# Patient Record
Sex: Female | Born: 1935 | Race: Black or African American | Hispanic: No | Smoking: Never smoker
Health system: Southern US, Community
[De-identification: ages and names within clinical notes are randomized; demographics above are authoritative.]

## PROBLEM LIST (undated history)

## (undated) DIAGNOSIS — E785 Hyperlipidemia, unspecified: Secondary | ICD-10-CM

## (undated) DIAGNOSIS — C801 Malignant (primary) neoplasm, unspecified: Secondary | ICD-10-CM

## (undated) DIAGNOSIS — I10 Essential (primary) hypertension: Secondary | ICD-10-CM

## (undated) DIAGNOSIS — Z9221 Personal history of antineoplastic chemotherapy: Secondary | ICD-10-CM

## (undated) DIAGNOSIS — C50919 Malignant neoplasm of unspecified site of unspecified female breast: Secondary | ICD-10-CM

## (undated) HISTORY — PX: MASTECTOMY: SHX3

## (undated) HISTORY — DX: Essential (primary) hypertension: I10

## (undated) HISTORY — PX: HYSTERECTOMY: SHX81

## (undated) HISTORY — PX: CHOLECYSTECTOMY: SHX55

## (undated) HISTORY — DX: Malignant neoplasm of unspecified site of unspecified female breast: C50.919

## (undated) HISTORY — PX: ABDOMINAL HYSTERECTOMY: SHX81

---

## 1988-08-23 HISTORY — PX: MASTECTOMY: SHX3

## 1997-01-18 ENCOUNTER — Inpatient Hospital Stay
Admission: EM | Admit: 1997-01-18 | Disposition: A | Discharge status: Home / Self Care | Payer: Self-pay | Source: Emergency Department | Admitting: Internal Medicine

## 1997-10-04 ENCOUNTER — Emergency Department: Admit: 1997-10-04 | Payer: Self-pay | Source: Emergency Department

## 1997-10-23 ENCOUNTER — Ambulatory Visit: Admit: 1997-10-23 | Disposition: A | Discharge status: Home / Self Care | Payer: Self-pay | Source: Ambulatory Visit

## 1998-01-09 ENCOUNTER — Ambulatory Visit: Admit: 1998-01-09 | Disposition: A | Discharge status: Home / Self Care | Payer: Self-pay | Source: Ambulatory Visit

## 1998-02-21 ENCOUNTER — Ambulatory Visit: Admission: RE | Admit: 1998-02-21 | Payer: Self-pay | Source: Ambulatory Visit | Admitting: Gastroenterology

## 1998-11-04 ENCOUNTER — Ambulatory Visit: Admit: 1998-11-04 | Disposition: A | Discharge status: Home / Self Care | Payer: Self-pay | Source: Ambulatory Visit

## 1999-11-04 ENCOUNTER — Observation Stay
Admission: EM | Admit: 1999-11-04 | Disposition: A | Discharge status: Home / Self Care | Payer: Self-pay | Source: Emergency Department | Admitting: Counseling

## 1999-11-18 ENCOUNTER — Ambulatory Visit
Admit: 1999-11-18 | Disposition: A | Discharge status: Home / Self Care | Payer: Self-pay | Source: Ambulatory Visit | Admitting: Family Medicine

## 2006-11-16 ENCOUNTER — Emergency Department: Admit: 2006-11-16 | Payer: Self-pay | Source: Emergency Department | Admitting: Emergency Medicine

## 2006-11-16 LAB — BASIC METABOLIC PANEL
BUN: 19 mg/dL (ref 8–20)
CO2: 27 mEq/L (ref 21–30)
Calcium: 10.8 mg/dL — ABNORMAL HIGH (ref 8.6–10.2)
Chloride: 98 mEq/L (ref 98–107)
Creatinine: 1 mg/dL (ref 0.6–1.5)
Glucose: 107 mg/dL — ABNORMAL HIGH (ref 70–100)
Potassium: 3.5 mEq/L — ABNORMAL LOW (ref 3.6–5.0)
Sodium: 138 mEq/L (ref 136–146)

## 2006-11-16 LAB — CBC WITH AUTO DIFFERENTIAL CERNER
Basophils Absolute: 0 /mm3 (ref 0.0–0.2)
Basophils: 0 % (ref 0–2)
Eosinophils Absolute: 0 /mm3 (ref 0.0–0.7)
Eosinophils: 1 % (ref 0–5)
Granulocytes Absolute: 4.6 /mm3 (ref 1.8–8.1)
Hematocrit: 38.1 % (ref 37.0–47.0)
Hgb: 13.3 G/DL (ref 12.0–16.0)
Lymphocytes Absolute: 1.6 /mm3 (ref 0.5–4.4)
Lymphocytes: 24 % (ref 15–41)
MCH: 29.9 PG (ref 28.0–32.0)
MCHC: 34.9 G/DL (ref 32.0–36.0)
MCV: 85.7 FL (ref 80.0–100.0)
MPV: 8.7 FL (ref 7.4–10.4)
Monocytes Absolute: 0.3 /mm3 (ref 0.0–1.2)
Monocytes: 5 % (ref 0–11)
Neutrophils %: 70 % (ref 52–75)
Platelets: 268 /mm3 (ref 140–400)
RBC: 4.45 /mm3 (ref 4.20–5.40)
RDW: 12.8 % (ref 11.5–15.0)
WBC: 6.6 /mm3 (ref 3.5–10.8)

## 2006-11-16 LAB — URINALYSIS WITH MICROSCOPIC
Bilirubin, UA: NEGATIVE
Blood, UA: NEGATIVE
Glucose, UA: NEGATIVE
Ketones UA: 40
Nitrite, UA: NEGATIVE
Protein, UR: NEGATIVE
Specific Gravity UA POCT: 1.015 (ref 1.001–1.035)
Urine pH: 6.5 (ref 5.0–8.0)
Urobilinogen, UA: 0.2

## 2006-11-16 LAB — GFR

## 2006-11-16 LAB — D-DIMER - SOFT: D-Dimer: 3401 ng FEU

## 2009-08-05 ENCOUNTER — Ambulatory Visit: Payer: Self-pay

## 2009-08-05 ENCOUNTER — Ambulatory Visit: Admission: RE | Admit: 2009-08-05 | Payer: Self-pay | Source: Ambulatory Visit | Admitting: Gastroenterology

## 2009-08-07 LAB — LAB USE ONLY - HISTORICAL SURGICAL PATHOLOGY

## 2011-04-02 LAB — COMPREHENSIVE METABOLIC PANEL
ALT: 31 U/L (ref 3–36)
AST (SGOT): 39 U/L (ref 10–41)
Albumin/Globulin Ratio: 1.5 (ref 1.1–1.8)
Albumin: 4.8 g/dL (ref 3.4–4.9)
Alkaline Phosphatase: 62 U/L (ref 43–112)
BUN: 9 mg/dL (ref 8–20)
Bilirubin, Total: 0.6 mg/dL (ref 0.1–1.0)
CO2: 26 mEq/L (ref 21–30)
Calcium: 10.1 mg/dL (ref 8.6–10.2)
Chloride: 90 mEq/L — ABNORMAL LOW (ref 98–107)
Creatinine: 0.8 mg/dL (ref 0.6–1.5)
Globulin: 3.2 g/dL (ref 2.0–3.7)
Glucose: 98 mg/dL (ref 70–100)
Potassium: 3.6 mEq/L (ref 3.6–5.0)
Protein, Total: 8 g/dL (ref 6.0–8.0)
Sodium: 128 mEq/L — ABNORMAL LOW (ref 136–146)

## 2011-04-02 LAB — URINALYSIS, REFLEX TO MICROSCOPIC EXAM IF INDICATED
Bilirubin, UA: NEGATIVE
Blood, UA: NEGATIVE
Glucose, UA: NEGATIVE
Ketones UA: 10
Leukocyte Esterase, UA: NEGATIVE
Nitrite, UA: NEGATIVE
Protein, UR: NEGATIVE
Specific Gravity UA POCT: 1.005 (ref 1.001–1.035)
Urine pH: 6.5 (ref 5.0–8.0)
Urobilinogen, UA: NORMAL mg/dL

## 2011-04-02 LAB — CBC AND DIFFERENTIAL
Basophils Absolute Automated: 0 10*3/uL (ref 0.00–0.20)
Basophils Automated: 0 % (ref 0–2)
Eosinophils Absolute Automated: 0.04 10*3/uL (ref 0.00–0.70)
Eosinophils Automated: 1 % (ref 0–5)
Hematocrit: 34.2 % — ABNORMAL LOW (ref 37.0–47.0)
Hgb: 12.3 g/dL (ref 12.0–16.0)
Immature Granulocytes Absolute: 0.01 10*3/uL
Immature Granulocytes: 0 % (ref 0–1)
Lymphocytes Absolute Automated: 1.97 10*3/uL (ref 0.50–4.40)
Lymphocytes Automated: 34 % (ref 15–41)
MCH: 29.1 pg (ref 28.0–32.0)
MCHC: 36 g/dL (ref 32.0–36.0)
MCV: 80.9 fL (ref 80.0–100.0)
MPV: 10.3 fL (ref 9.4–12.3)
Monocytes Absolute Automated: 0.34 10*3/uL (ref 0.00–1.20)
Monocytes: 6 % (ref 0–11)
Neutrophils Absolute: 3.51 10*3/uL (ref 1.80–8.10)
Neutrophils: 60 % (ref 52–75)
Nucleated RBC: 0 /100 WBC
Platelets: 270 10*3/uL (ref 140–400)
RBC: 4.23 10*6/uL (ref 4.20–5.40)
RDW: 12 % (ref 12–15)
WBC: 5.87 10*3/uL (ref 3.50–10.80)

## 2011-04-02 LAB — GFR: EGFR: >60

## 2011-04-02 LAB — IHS D-DIMER: D-Dimer: 4.85 ug/mL FEU — ABNORMAL HIGH (ref 0.00–0.49)

## 2011-04-02 LAB — I-STAT TROPONIN: i-STAT Troponin: 0 ng/mL (ref 0.00–0.09)

## 2011-04-03 ENCOUNTER — Inpatient Hospital Stay
Admission: EM | Admit: 2011-04-03 | Disposition: A | Discharge status: Home / Self Care | Payer: Self-pay | Source: Emergency Department | Attending: Hospitalist | Admitting: Hospitalist

## 2011-04-03 LAB — LIPID PANEL
Cholesterol / HDL Ratio: 3 Index
Cholesterol: 186 mg/dL (ref 0–199)
HDL: 63 mg/dL (ref >40)
LDL Calculated: 104 mg/dL — ABNORMAL HIGH (ref 0–99)
Triglycerides: 97 mg/dL (ref 34–149)
VLDL Calculated: 19 mg/dL (ref 10–40)

## 2011-04-03 LAB — FOLATE: Folate: 23.9 ng/mL

## 2011-04-03 LAB — TSH: TSH: 1.326 u[IU]/mL (ref 0.350–4.940)

## 2011-04-03 LAB — I-STAT TROPONIN: i-STAT Troponin: 0.01 ng/mL (ref 0.00–0.09)

## 2011-04-03 LAB — VITAMIN B12: Vitamin B-12: 742 pg/mL (ref 211–911)

## 2011-04-03 LAB — HEMOLYSIS INDEX: Hemolysis Index: 3 Index (ref 0–9)

## 2011-04-03 LAB — CLOSTRIDIUM DIFFICILE TOXIN B PCR: Stool Clostridium difficile Toxins A and B EIA: NEGATIVE

## 2011-04-03 LAB — SEDIMENTATION RATE: Sed Rate: 26 mm/Hr (ref 0–26)

## 2011-04-04 LAB — CBC
Hematocrit: 29.2 % — ABNORMAL LOW (ref 37.0–47.0)
Hgb: 10.3 g/dL — ABNORMAL LOW (ref 12.0–16.0)
MCH: 28.3 pg (ref 28.0–32.0)
MCHC: 35.3 g/dL (ref 32.0–36.0)
MCV: 80.2 fL (ref 80.0–100.0)
MPV: 9.9 fL (ref 9.4–12.3)
Nucleated RBC: 0 /100 WBC
Platelets: 228 10*3/uL (ref 140–400)
RBC: 3.64 10*6/uL — ABNORMAL LOW (ref 4.20–5.40)
RDW: 13 % (ref 12–15)
WBC: 3.48 10*3/uL — ABNORMAL LOW (ref 3.50–10.80)

## 2011-04-04 LAB — POTASSIUM, URINE, RANDOM: Urine Potassium Random: 17.4 mEq/L

## 2011-04-04 LAB — SODIUM, URINE, RANDOM: Urine Sodium Random: <20 mEq/L

## 2011-04-04 LAB — BASIC METABOLIC PANEL
BUN: 3 mg/dL — ABNORMAL LOW (ref 8–20)
CO2: 24 mEq/L (ref 21–30)
Calcium: 8.6 mg/dL (ref 8.6–10.2)
Chloride: 93 mEq/L — ABNORMAL LOW (ref 98–107)
Creatinine: 0.9 mg/dL (ref 0.6–1.5)
Glucose: 98 mg/dL (ref 70–100)
Potassium: 3.1 mEq/L — ABNORMAL LOW (ref 3.6–5.0)
Sodium: 128 mEq/L — ABNORMAL LOW (ref 136–146)

## 2011-04-04 LAB — CHLORIDE, URINE, RANDOM: Chloride, UR: 39 mEq/L

## 2011-04-04 LAB — GFR: EGFR: >60

## 2011-04-05 LAB — BASIC METABOLIC PANEL
BUN: <2 mg/dL — ABNORMAL LOW (ref 8–20)
CO2: 23 mEq/L (ref 21–30)
Calcium: 8.9 mg/dL (ref 8.6–10.2)
Chloride: 101 mEq/L (ref 98–107)
Creatinine: 0.8 mg/dL (ref 0.6–1.5)
Glucose: 84 mg/dL (ref 70–100)
Potassium: 3.3 mEq/L — ABNORMAL LOW (ref 3.6–5.0)
Sodium: 134 mEq/L — ABNORMAL LOW (ref 136–146)

## 2011-04-05 LAB — CBC
Hematocrit: 29.8 % — ABNORMAL LOW (ref 37.0–47.0)
Hgb: 10.5 g/dL — ABNORMAL LOW (ref 12.0–16.0)
MCH: 28.3 pg (ref 28.0–32.0)
MCHC: 35.2 g/dL (ref 32.0–36.0)
MCV: 80.3 fL (ref 80.0–100.0)
MPV: 9.9 fL (ref 9.4–12.3)
Nucleated RBC: 0 /100 WBC
Platelets: 232 10*3/uL (ref 140–400)
RBC: 3.71 10*6/uL — ABNORMAL LOW (ref 4.20–5.40)
RDW: 13 % (ref 12–15)
WBC: 3.3 10*3/uL — ABNORMAL LOW (ref 3.50–10.80)

## 2011-04-05 LAB — GFR: EGFR: >60

## 2011-04-05 LAB — OSMOLALITY, URINE: Urine Osmolality: 133 mOsm/kg — ABNORMAL LOW (ref 300–1090)

## 2011-04-08 LAB — CULTURE BLOOD, AEROBIC
Culture Blood: NO GROWTH
Culture Blood: NO GROWTH

## 2011-05-05 ENCOUNTER — Ambulatory Visit
Admission: RE | Admit: 2011-05-05 | Disposition: A | Discharge status: Home / Self Care | Payer: Self-pay | Source: Ambulatory Visit | Attending: Gastroenterology | Admitting: Gastroenterology

## 2011-05-05 ENCOUNTER — Ambulatory Visit: Payer: Self-pay

## 2011-05-06 LAB — LAB USE ONLY - HISTORICAL SURGICAL PATHOLOGY

## 2011-05-30 LAB — ECG 12-LEAD
Atrial Rate: 68 {beats}/min
P Axis: 20 degrees
P-R Interval: 146 ms
Q-T Interval: 384 ms
QRS Duration: 72 ms
QTC Calculation (Bezet): 408 ms
R Axis: 22 degrees
T Axis: 10 degrees
Ventricular Rate: 68 {beats}/min

## 2011-06-24 LAB — ECG 12-LEAD
Atrial Rate: 60 {beats}/min
P Axis: 35 degrees
P-R Interval: 174 ms
Q-T Interval: 386 ms
QRS Duration: 90 ms
QTC Calculation (Bezet): 386 ms
R Axis: 7 degrees
T Axis: -2 degrees
Ventricular Rate: 60 {beats}/min

## 2011-07-10 NOTE — H&P (Signed)
Anna Marks, Anna Marks      MRN:          [id number]      Account:      [id number]      Document ID:  [id number]                  Admit Date: 04/03/2011            Patient Location: [id number]      Patient Type: I            ATTENDING PHYSICIAN: Hoy Morn, MD                  PRIMARY CARE PHYSICIAN:      Dr. Marlena Clipper.            GASTROINTESTINAL DOCTOR:      Dr. Gaylyn Cheers.            REASON FOR ADMISSION:      Abdominal pain for about two weeks with diarrhea.            HISTORY OF PRESENT ILLNESS:      The patient is a 77 year old female, history of hypertension,      hyperlipidemia, reflux disease, who was in her usual state of health until      a week or two ago when she was given Cipro for a UTI.            Two weeks prior to admission, the patient started having intermittent      abdominal pain, mainly in the right lower quadrant and left lower quadrant      area, associated with nausea without vomiting.  The patient does have      diarrhea with greenish color stool.  The patient had about three or four      bouts of diarrhea a day.            On the day of admission, the patient started feeling weak and had some      shortness of breath without chest pain, without hemoptysis, without leg      pain or swelling.            In the emergency room, the patient's vital sign showed a temperature of      95.6, heart rate is 55, blood pressure 130 to 160/70, oxygen saturation 99%      on room air.  The patient's white count was normal; however, the patient's      CT of the abdomen and pelvis shows cecal diverticulitis, normal-appearing      appendix, diverticulosis.  The patient's chest x-ray did not show any      active lung disease.  The patient was given oral Flagyl in the ED.  The      patient was admitted for workup and treatment of abdominal pain, probably      due to cecal diverticulitis, rule out pseudomembranous colitis.  The      patient's shortness of breath probably due to diverticulitis and  weakness      and dehydration; however, pulmonary embolism needs to be ruled out.            PAST MEDICAL HISTORY:      Significant for diverticulosis, hypertension, hyperlipidemia, recent UTI,  Page 1 of 4      Anna Marks, Anna Marks      MRN:          [id number]      Account:      [id number]      Document ID:  [id number]                  reflux disease, allergic rhinitis, cholecystectomy, hysterectomy, and      mastectomy.  No diabetes or coronary disease.            ALLERGIES:      The patient had no allergy to medications.            MEDICATIONS:      At home include Toprol-XL 50 mg daily, lisinopril 10 mg daily with      hydrochlorothiazide, Lomotil p.r.n. _____, Cipro, Crestor 10 mg daily,      Aciphex daily, Zyrtec 10 mg daily as needed for allergy.            FAMILY HISTORY:      Significant for diabetes, hypertension.            SOCIAL HISTORY:      Nonsmoker, no alcohol abuse.            REVIEW OF SYSTEMS:      Was noted in history of present illness.  The patient denied any fever,      chills, or sweats.  No syncope.  The patient complained of generalized      weakness.            The patient denied any headache, double vision, or sore throat.            The patient denied any neck pain or swelling.            The patient denied chest pain, hemoptysis, discolored sputum or increasing      cough.            The patient's abdominal symptoms as noted above.            The patient denied any palpitation, orthopnea, PND, or ankle edema.            The patient denied any dysuria, hematuria, or flank pain.            The patient denied leg pain or swelling.            PHYSICAL EXAMINATION:      GENERAL:  Revealed a well-developed, well-nourished female, awake, alert,      oriented x3, in no acute distress.      VITAL SIGNS:  The patient's current vital signs showed temperature 96.7,      heart rate 45, respirations 16, blood pressure 127/53, 100% on room air.      HEENT:   Normocephalic, atraumatic.  Pupils equal.  Extraocular movements      intact.  Tongue midline.  Sclerae are anicteric.  Oral mucosa moist.      NECK:  Supple without jugular distension or thyromegaly.                                   Page 2 of 4      Anna Marks, Anna Marks      MRN:          [id number]      Account:  [id number]      Document ID:  [id number]                  LUNGS:  Clear to auscultation and percussion.      CARDIAC:  Regular sinus rhythm, bradycardia, S1, S2, without S3, murmur or      rub.      ABDOMEN:  Flat, soft.  Slightly tender in the right lower quadrant without      guarding or rebound.  Bowel sounds normoactive.      EXTREMITIES:  Show full range of motion x4 without edema, clubbing,      cyanosis, or calf tenderness.  No skin lesion.            LABORATORY AND DIAGNOSTIC DATA:      CMP essentially normal except sodium 128.  WBC 5.87, hematocrit 34.2%.            X-ray as noted above.            IMPRESSION:      1.  Abdominal pain with diarrhea for two weeks and abnormal CT of the      abdomen and pelvis, probably due to cervical diverticulitis,  However,      pseudomembranous colitis with recent antibiotic treatment needs to be ruled      out.      2.  Diverticulosis.      3.  Hyponatremia, probably due to sodium depletion; however SIADH and      hypothyroidism need to be ruled out.      4.  Shortness of breath with normal chest x-ray and normal oxygen      saturation and respiratory rate, probably due to #1.  However, pulmonary      embolism needs to be ruled out.  The patient did have slightly elevated      D-dimer.      5.  Hypertension.      6.  Hyperlipidemia.      7.  Reflux disease.      8.  Allergic rhinitis.      9.  Cholecystectomy and hysterectomy before.      10.  UA negative for infection.            RECOMMENDATION:      1.  Panculture.      2.  Empiric antibiotic with Zosyn and Flagyl.      3.  Subcutaneous heparin prophylaxis.      4.  Check VQ lung scan _____.       5.  Gastrointestinal consult.      6.  Intravenous fluid.      7.  Continue home medication that are urgent      8.  Clear liquids at this point.      9.  Oral Flagyl until stool C. difficile is available.                                                     Page 3 of 4      Anna Marks, Anna Marks      MRN:          [id number]      Account:      [id number]      Document ID:  [id number]  Electronic Signing Provider            D:  04/03/2011 09:39 AM by Dr. Theotis Barrio. Heron Nay, MD [phone])      T:  04/03/2011 10:38 AM by [id number]                  [place]:[id number] Osborne Casco MD      Gaylyn Cheers MD                                   Page 4 of 4      Authenticated by Theotis Barrio. Eamonn Sermeno, MD [phone]) On 04/06/2011 01:42:39 PM

## 2011-07-10 NOTE — Discharge Summary (Signed)
Anna Marks, BIGGS      MRN:          [id number]      Account:      [id number]      Document ID:  [id number]                  Admit Date: 04/03/2011      Discharge Date:            ATTENDING PHYSICIAN:  Hoy Morn, MD                  PRIMARY CARE PHYSICIAN:      Dr. Marlena Clipper.            GASTROINTESTINAL DOCTOR:      Dr. Gaylyn Cheers.            HISTORY OF PRESENT ILLNESS:      For detailed admission history and physical, please see dictated H and P.      In summary, the patient is a 77 year old female, history of hypertension,      hyperlipidemia, reflux disease, who was given Cipro for UTI 2 weeks prior      to admission.            On the day of admission, the patient felt weak, short of breath, this was      without chest pain and without leg pain or swelling.  The patient      apparently had diarrhea a couple of times a day since she was on Cipro.      Two weeks prior to admission, she also had intermittent abdominal pain in      the right lower quadrant and left lower quadrant associated with nausea      without vomiting.            In the emergency room, the patient's CT of the abdomen and pelvis showed      cecal diverticulitis.  Chest x-ray did not show any active lung disease.      The patient's UA negative for infection.  Stool negative for Clostridium      difficile.            PHYSICAL EXAMINATION:      GENERAL:  Showed well-developed, well-nourished female in no acute      distress.      VITAL SIGNS:  Afebrile.  Vital signs stable.      HEENT:  Examination unremarkable.      NECK:  Supple.      LUNGS:  Clear.      CARDIAC:  Regular sinus rhythm, S1, S2 without S3, murmur, or rub.      ABDOMEN:  Tender in the right lower quadrant without guarding or rebound.      EXTREMITIES:  Did not show any edema, clubbing, cyanosis, or calf      tenderness.            LABORATORY DATA:      X-ray as noted above.  VQ lung scan was negative for PE.                                         Page 1 of 3       MERCY, LEPPLA      MRN:          [id number]  Account:      [id number]      Document ID:  [id number]                  The patient's admission WBC was 5.83, hematocrit 34.2%.  Sodium 128.  CMP      otherwise unremarkable.  CK, troponin negative.  Lipid profile showed      cholesterol 186, LDL 104.  B12, folic acid level were normal.  TSH 1.326.      Sedimentation rate 26.            HOSPITAL COURSE:      The patient admitted for workup and treatment of abdominal pain, nausea,      vomiting, diarrhea, probably due to cecal diverticulitis.  The patient      continued on some of her home medication including Zestril and Toprol.  The      patient initially given oral Flagyl and IV Levaquin prior to Clostridium      difficile study.  When Clostridium difficile study came back negative, the      patient was placed on IV Flagyl and IV Levaquin.  The patient has some      nausea with oral Flagyl.            The patient was seen by gastroenterologist who concurred with the above      diagnosis and treatment.  The patient able to tolerate a full liquid diet      on the day of discharge.  The patient's abdominal pain, nausea, vomiting,      diarrhea began to resolve.  The patient just felt weak.  The patient was      cleared by GI to be discharged home on oral Augmentin and advance diet as      tolerated.  The patient to follow with Dr. Gaylyn Cheers and plan for a      colonoscopy in September.            The patient's blood culture came back negative.  Urine culture negative.      The patient's blood pressure is controlled with current medication.  The      patient's urine sodium less than 10.  The patient's hyponatremia probably      due to sodium depletion.  On the day of discharge, the patient's sodium is      up to 134.  The patient had low potassium, probably due to sodium depletion      and diuretic.            DISCHARGE MEDICATIONS:      The patient was discharged home on Tylenol as needed for pain,  Zyrtec 10 mg      daily, lisinopril HCT home dose, Toprol-XL 50 mg p.o. daily, Aciphex home      dose, Crestor 10 mg daily, Augmentin 875 mg twice a day for 10 days.  The      patient had a banana and  orange juice to supplement potassium.  The      patient to follow with Dr. Marlena Clipper and Dr. Iline Oven.            PRINCIPAL DIAGNOSES:      1.  Cecal diverticulitis, improved with treatment.      2.  Hypertension.      3.  Hyponatremia, due to sodium depletion, stable.      4.  Shortness of breath, probably due to diverticulitis.  No evidence of  pulmonary embolism or pneumonia.      5.  Hypertension, controlled medically      6.  Hyperlipidemia with normal lipid profile.      7.  Reflux disease.                                   Page 2 of 3      VERDA, MEHTA      MRN:          [id number]      Account:      [id number]      Document ID:  [id number]                  8.  Allergic rhinitis.      9.  New urinary tract infection.                        Electronic Signing Provider            D:  04/05/2011 09:58 AM by Dr. Theotis Barrio. Heron Nay, MD [phone])      T:  04/05/2011 10:17 AM by [id number]                        YQ:[id number] Osborne Casco MD      Gaylyn Cheers MD                                   Page 3 of 3      Authenticated by Theotis Barrio. Jarmarcus Wambold, MD [phone]) On 04/06/2011 01:42:50 PM

## 2011-08-10 NOTE — Op Note (Signed)
Introduction:[id number] Document ID: [id number] -- 77 year old female      patient presents for an outpatient Esophagogastroduodenoscopy on      08/05/2009.            Indications: Early satiety (780.94). Diarrhea (787.91).            Consent: The benefits, risks, and alternatives to the procedure were      discussed and informed consent was obtained from the patient.            Preparation: EKG, pulse, pulse oximetry, and blood pressure were monitored      throughout the procedure. The patient was kept NPO. ASA Classification:      Class 2 - Patient has mild to moderate systemic disturbance that may or      may not be related to the disorder requiring surgery.            Medications: IVA anesthesia.            Procedure: The gastroscope was passed through the mouth under direct      visualization and was advanced with ease to the 2nd portion of the      duodenum. The scope was withdrawn and the mucosa was carefully examined.      The views were excellent. The patient's toleration of the procedure was      excellent. Retroflexion was performed in the stomach.            Estimated Blood Loss: Negligible.            Findings:   Esophagus: Mildly erythematous mucosa was found in the      esophagus. Multiple biopsies were taken. Otherwise, the esophagus appeared      to be normal.  GE junction: There was a small sliding hiatus hernia      visible in the GE junction.   Stomach: The antrum appeared to be normal.      Multiple biopsies were taken from the stomach. The specimens were      collected for pathology and a urease test. The body of the stomach,      cardia, and fundus appeared to be normal.   Pylorus: The pylorus appeared      to be normal, symmetrical, and patent.  Duodenum: The duodenal bulb and      2nd portion of the duodenum appeared to be normal.  Multiple biopsies were      taken from the duodenum. The specimens were collected for pathology.            Unplanned Events: There were no unplanned events.             Summary: Mildly erythematous mucosa was found in the esophagus. Multiple      biopsies taken. A hiatus hernia was found in the GE junction (553.3).      Normal antrum. Multiple biopsies taken. Normal body of the stomach,      cardia, and fundus. Normal, symmetrical, and patent pylorus. Normal      duodenal bulb and 2nd portion of the duodenum. Multiple biopsies taken.            Recommendations: Continue current medications as directed by the      physician. Follow-up on the results of the biopsy specimens.            Procedure Codes: [43239]EGD with biopsy            Performed By:  Version 1, electronically signed by Dr. Gaylyn Cheers on 08/05/2009 at      14:28.

## 2011-08-10 NOTE — Op Note (Signed)
Introduction: [id number] Document ID: [id number] -- 77 year old female      patient presents for an outpatient Colonoscopy on 05/05/2011.            Indications: Abdominal pain (789.00). Diarrhea (787.91). Abnormal CT scan      (793.4).            Consent: The benefits, risks, and alternatives to the procedure were      discussed and informed consent was obtained from the patient.            Preparation: EKG, pulse, pulse oximetry, and blood pressure were monitored      throughout the procedure. The patient was kept NPO. ASA Classification:      Class 2 - Patient has mild to moderate systemic disturbance that may or      may not be related to the disorder requiring surgery.            Medications: IVA anesthesia.            Rectal Exam: Normal rectal exam.            Procedure: The colonoscope was passed through the anus under direct      visualization and was advanced with ease to the terminal ileum. The scope      was withdrawn and the mucosa was carefully examined. The quality of the      preparation was excellent. The views were excellent. The patient's      toleration of the procedure was excellent. Retroflexion was performed in      the rectum.            Estimated Blood Loss: Negligible.            Findings: The cecum, ascending colon, transverse colon, descending colon,      sigmoid colon, and rectum appeared to be normal.     A biopsy was taken      from the cecum and ascending colon. No evidence of diverticula in the      colon. Specifically, there were no diverticuli in the cecum. No evidence      of colitis in the colon. No evidence of polyps in the colon. Small      internal hemorrhoids were found. The terminal ileum appeared to be normal.            Unplanned Events: There were no unplanned events.            Summary: Normal cecum, ascending colon, transverse colon, descending      colon, sigmoid colon, and rectum. Biopsy taken. Specifically, there were      no diverticuli in the cecum. Internal  hemorrhoids found (455.0). Normal      terminal ileum.            Recommendations: Await biopsy result. Follow-up appointment with Theron Arista L.      Iline Oven, MD in 1 month.            Procedure Codes: [45380]Colonoscopy with biopsy            Performed By:      Version 1, electronically signed by Dr. Gaylyn Cheers on 05/05/2011 at      14:22.      [email or web address]

## 2013-09-06 DIAGNOSIS — K529 Noninfective gastroenteritis and colitis, unspecified: Secondary | ICD-10-CM | POA: Diagnosis not present

## 2013-10-04 DIAGNOSIS — K529 Noninfective gastroenteritis and colitis, unspecified: Secondary | ICD-10-CM | POA: Diagnosis not present

## 2013-10-09 ENCOUNTER — Emergency Department: Payer: Medicare Other

## 2013-10-09 ENCOUNTER — Emergency Department
Admission: EM | Admit: 2013-10-09 | Discharge: 2013-10-09 | Disposition: A | Discharge status: Home / Self Care | Payer: Medicare Other | Attending: Emergency Medicine | Admitting: Emergency Medicine

## 2013-10-09 DIAGNOSIS — N3289 Other specified disorders of bladder: Secondary | ICD-10-CM | POA: Diagnosis not present

## 2013-10-09 DIAGNOSIS — R143 Flatulence: Secondary | ICD-10-CM | POA: Diagnosis not present

## 2013-10-09 DIAGNOSIS — R109 Unspecified abdominal pain: Secondary | ICD-10-CM | POA: Diagnosis not present

## 2013-10-09 DIAGNOSIS — R197 Diarrhea, unspecified: Secondary | ICD-10-CM | POA: Diagnosis not present

## 2013-10-09 DIAGNOSIS — R110 Nausea: Secondary | ICD-10-CM | POA: Diagnosis not present

## 2013-10-09 DIAGNOSIS — K3189 Other diseases of stomach and duodenum: Secondary | ICD-10-CM | POA: Diagnosis not present

## 2013-10-09 DIAGNOSIS — R51 Headache: Secondary | ICD-10-CM | POA: Diagnosis not present

## 2013-10-09 DIAGNOSIS — I10 Essential (primary) hypertension: Secondary | ICD-10-CM | POA: Diagnosis not present

## 2013-10-09 DIAGNOSIS — Z9089 Acquired absence of other organs: Secondary | ICD-10-CM | POA: Diagnosis not present

## 2013-10-09 DIAGNOSIS — R141 Gas pain: Secondary | ICD-10-CM | POA: Diagnosis not present

## 2013-10-09 DIAGNOSIS — I700 Atherosclerosis of aorta: Secondary | ICD-10-CM | POA: Diagnosis not present

## 2013-10-09 DIAGNOSIS — Z90710 Acquired absence of both cervix and uterus: Secondary | ICD-10-CM | POA: Diagnosis not present

## 2013-10-09 LAB — CBC AND DIFFERENTIAL
Basophils Absolute Automated: 0 10*3/uL (ref 0.00–0.20)
Basophils Automated: 0 %
Eosinophils Absolute Automated: 0.09 10*3/uL (ref 0.00–0.70)
Eosinophils Automated: 2 %
Hematocrit: 39.4 % (ref 37.0–47.0)
Hgb: 13.4 g/dL (ref 12.0–16.0)
Immature Granulocytes Absolute: 0.02 10*3/uL
Immature Granulocytes: 0 %
Lymphocytes Absolute Automated: 1.72 10*3/uL (ref 0.50–4.40)
Lymphocytes Automated: 32 %
MCH: 29.8 pg (ref 28.0–32.0)
MCHC: 34 g/dL (ref 32.0–36.0)
MCV: 87.8 fL (ref 80.0–100.0)
MPV: 10.9 fL (ref 9.4–12.3)
Monocytes Absolute Automated: 0.36 10*3/uL (ref 0.00–1.20)
Monocytes: 7 %
Neutrophils Absolute: 3.22 10*3/uL (ref 1.80–8.10)
Neutrophils: 59 %
Nucleated RBC: 0 (ref 0–1)
Platelets: 222 10*3/uL (ref 140–400)
RBC: 4.49 10*6/uL (ref 4.20–5.40)
RDW: 13 % (ref 12–15)
WBC: 5.41 10*3/uL (ref 3.50–10.80)

## 2013-10-09 LAB — URINALYSIS WITH MICROSCOPIC
Bilirubin, UA: NEGATIVE
Blood, UA: NEGATIVE
Glucose, UA: NEGATIVE
Ketones UA: NEGATIVE
Leukocyte Esterase, UA: NEGATIVE
Nitrite, UA: NEGATIVE
Protein, UR: 30 — AB
Specific Gravity UA: 1.008 (ref 1.001–1.035)
Urine pH: 6.5 (ref 5.0–8.0)
Urobilinogen, UA: NORMAL mg/dL

## 2013-10-09 LAB — HEPATIC FUNCTION PANEL
ALT: 19 U/L (ref 0–55)
AST (SGOT): 24 U/L (ref 5–34)
Albumin/Globulin Ratio: 1.3 (ref 0.9–2.2)
Albumin: 4.4 g/dL (ref 3.5–5.0)
Alkaline Phosphatase: 50 U/L (ref 40–150)
Bilirubin Direct: 0.2 mg/dL (ref 0.0–0.5)
Bilirubin Indirect: 0.1 mg/dL (ref 0.0–1.1)
Bilirubin, Total: 0.3 mg/dL (ref 0.2–1.2)
Globulin: 3.3 g/dL (ref 2.0–3.6)
Protein, Total: 7.7 g/dL (ref 6.0–8.3)

## 2013-10-09 LAB — BASIC METABOLIC PANEL
BUN: 8 mg/dL (ref 7.0–19.0)
CO2: 22 mEq/L (ref 22–29)
Calcium: 10.1 mg/dL (ref 7.9–10.6)
Chloride: 103 mEq/L (ref 98–107)
Creatinine: 0.8 mg/dL (ref 0.6–1.0)
Glucose: 133 mg/dL — ABNORMAL HIGH (ref 70–100)
Potassium: 3.9 mEq/L (ref 3.5–5.1)
Sodium: 136 mEq/L (ref 136–145)

## 2013-10-09 LAB — GFR: EGFR: >60

## 2013-10-09 LAB — LIPASE: Lipase: 45 U/L (ref 8–78)

## 2013-10-09 MED ORDER — IOHEXOL 240 MG/ML IJ SOLN
50.0000 mL | Freq: Once | INTRAMUSCULAR | Status: AC | PRN
Start: 2013-10-09 — End: 2013-10-09
  Administered 2013-10-09: 50 mL via ORAL

## 2013-10-09 MED ORDER — SODIUM CHLORIDE 0.9 % IV BOLUS
500.0000 mL | Freq: Once | INTRAVENOUS | Status: AC
Start: 2013-10-09 — End: 2013-10-09
  Administered 2013-10-09: 500 mL via INTRAVENOUS

## 2013-10-09 MED ORDER — IOHEXOL 350 MG/ML IV SOLN
100.0000 mL | Freq: Once | INTRAVENOUS | Status: AC | PRN
Start: 2013-10-09 — End: 2013-10-09
  Administered 2013-10-09: 100 mL via INTRAVENOUS

## 2013-10-09 NOTE — Discharge Instructions (Signed)
Follow up with your GI doctor march 2nd. prilosec as prescribed   Abdominal Pain    You have been diagnosed with abdominal (belly) pain. The cause of your pain is not yet known.    Many things can cause abdominal pain. Examples include viral infections and bowel (intestine) spasms. You might need another examination or more tests to find out why you have pain.    At this time, your pain does not seem to be caused by anything dangerous. You do not need surgery. You do not need to stay in the hospital.     Though we don't believe your condition is dangerous right now, it is important to be careful. Sometimes a problem that seems mild can become serious later. This is why it is very important that you return here or go to the nearest Emergency Department unless you are 100% improved.    Return here or go to the nearest Emergency Department, or follow up with your physician in:       Drink only clear liquids such as water, clear broth, sports drinks, or clear caffeine-free soft drinks, like 7-Up or Sprite, for the next:   24 hours.    YOU SHOULD SEEK MEDICAL ATTENTION IMMEDIATELY, EITHER HERE OR AT THE NEAREST EMERGENCY DEPARTMENT, IF ANY OF THE FOLLOWING OCCURS:   Your pain does not go away or gets worse.   You cannot keep fluids down or your vomit is dark green.    You vomit blood or see blood in your stool. Blood might be bright red or dark red. It can also be black and look like tar.   You have a fever or shaking chills.   Your skin or eyes look yellow or your urine looks brown.   You have severe diarrhea.

## 2013-10-09 NOTE — ED Provider Notes (Signed)
Physician/Midlevel provider first contact with patient: 10/09/13 1738         History     Chief Complaint   Patient presents with   . Abdominal Pain     Anna Marks is a 80 y.o. female p/w 1 month of diarrhea c/o worsening sxs. Pt reports thin BM with abd distention. Pt states when she does not eat she started shaking but when she eats her abd distention returns. Pt reports she has been belching a lot. Pt was seen by PCP today for her abd pain. Pt states when her sxs first started one month ago she lost 5 lbs but denies any recent weight loss. Pt was seen by PCP a month ago for sxs and was given cipro for possible inflammation. Pt had no relief so she was given xifaxan. Pt was told she has inflammation in her bowel 3 years ago (collitis?). In 2012 Dr. Iline Oven did a colonoscopy and found polyps. Pt reports sinus HA, nausea, nasal congestion. Pt denies sore throat, dizziness or other concerns.     Past Medical History   Diagnosis Date   . Hypertension    . Breast cancer        Past Surgical History   Procedure Date   . Hysterectomy    . Cholecystectomy    . Mastectomy        History reviewed. No pertinent family history.    Social  History   Substance Use Topics   . Smoking status: Not on file   . Smokeless tobacco: Not on file   . Alcohol Use: Yes      Comment: social wine       .     Allergies   Allergen Reactions   . Neosporin (Neomycin-Bacitracin Zn-Polymyx)        Current/Home Medications    METOPROLOL XL (TOPROL-XL) 50 MG 24 HR TABLET    Take 50 mg by mouth daily.    ROSUVASTATIN (CRESTOR) 5 MG TABLET    Take 5 mg by mouth daily.        Review of Systems   Constitutional: Negative for fever, chills and diaphoresis.   HENT: Positive for congestion. Negative for hearing loss, rhinorrhea and sore throat.    Eyes: Negative for discharge and visual disturbance.   Respiratory: Negative for cough and shortness of breath.    Cardiovascular: Negative for chest pain.   Gastrointestinal: Positive for nausea.  Negative for vomiting, abdominal pain, diarrhea and blood in stool.   Genitourinary: Negative for dysuria, frequency and hematuria.   Musculoskeletal: Negative for back pain and neck pain.   Skin: Negative for color change and rash.   Neurological: Positive for headaches. Negative for dizziness, weakness, light-headedness and numbness.   All other systems reviewed and are negative.        Physical Exam    BP: 216/88 mmHg, Heart Rate: 66 , Temp: 99 F (37.2 C), Resp Rate: 16 , SpO2: 100 %     Physical Exam   Nursing note and vitals reviewed.  Constitutional: She is oriented to person, place, and time. She appears well-developed and well-nourished. No distress.   HENT:   Head: Normocephalic and atraumatic.   Nose: Nose normal.   Eyes: Conjunctivae normal and EOM are normal. Pupils are equal, round, and reactive to light. Right eye exhibits no discharge. Left eye exhibits no discharge.   Neck: Neck supple. No JVD present. No tracheal deviation present.   Cardiovascular: Normal rate, regular rhythm,  normal heart sounds and intact distal pulses.  Exam reveals no gallop and no friction rub.    No murmur heard.  Pulmonary/Chest: Breath sounds normal. No respiratory distress.   Abdominal: Soft. Bowel sounds are normal. There is tenderness. There is no rebound, no guarding and no CVA tenderness.        Mild TTP of the epigastric region, no guarding, no masses, no rebound   Musculoskeletal: Normal range of motion. She exhibits no edema and no tenderness.   Lymphadenopathy:     She has no cervical adenopathy.   Neurological: She is alert and oriented to person, place, and time. Abnormal muscle tone: normal symetric tone by observation.   Skin: Skin is warm and dry. No rash noted. She is not diaphoretic.   Psychiatric: She has a normal mood and affect. Judgment normal.       MDM and ED Course     ED Medication Orders      Start     Status Ordering Provider    10/09/13 1735   sodium chloride 0.9 % bolus 500 mL   Once       Route: Intravenous  Ordered Dose: 500 mL         Last MAR action:  Stopped Kalima Saylor, Osborne Oman III                 MDM  Number of Diagnoses or Management Options  Diagnosis management comments: First MD: Ronald Lobo, MD    Pulse oximetry on room air is 100%.    DDx: obx, colitis, pancreatitis, Ca    Results     Procedure Component Value Units Date/Time    Urinalysis with microscopic ([id number])  (Abnormal) Collected:10/09/13   1833    Specimen Information:Urine Updated:10/09/13 1849     Urine Type Clean Catch      Color, UA Yellow      Clarity, UA Clear      Specific Gravity UA 1.008      Urine pH 6.5      Leukocyte Esterase, UA Negative      Nitrite, UA Negative      Protein, UR 30 (A)      Glucose, UA Negative      Ketones UA Negative      Urobilinogen, UA Normal mg/dL      Bilirubin, UA Negative      Blood, UA Negative      RBC, UA 0 - 5 /hpf      Squamous Epithelial Cells, Urine 0 - 5 /hpf     Basic Metabolic Panel (BMP) ([id number])  (Abnormal) Collected:10/09/13   1803    Specimen Information:Blood Updated:10/09/13 1826     Glucose 133 (H) mg/dL      BUN 8.0 mg/dL      Creatinine 0.8 mg/dL      CALCIUM 25.3 mg/dL      Sodium 664 mEq/L      Potassium 3.9 mEq/L      Chloride 103 mEq/L      CO2 22 mEq/L     Lipase ([id number]) Collected:10/09/13 1803    Specimen Information:Blood Updated:10/09/13 1826     Lipase 45 U/L     Hepatic function panel (LFTs) ([id number]) Collected:10/09/13 1803    Specimen Information:Blood Updated:10/09/13 1826     Bilirubin, Total 0.3 mg/dL      Bilirubin, Direct 0.2 mg/dL      Bilirubin, Indirect 0.1 mg/dL      AST (SGOT) 24 U/L  ALT 19 U/L      Alkaline Phosphatase 50 U/L      Protein, Total 7.7 g/dL      Albumin 4.4 g/dL      Globulin 3.3 g/dL      Albumin/Globulin Ratio 1.3     GFR ([id number]) Collected:10/09/13 1803     EGFR >60.0 Updated:10/09/13 1826    CBC with differential ([id number]) Collected:10/09/13 1803    Specimen Information:Blood / Blood Updated:10/09/13 1815     WBC  5.41 x10 3/uL      RBC 4.49 x10 6/uL      Hgb 13.4 g/dL      Hematocrit 14.7 %      MCV 87.8 fL      MCH 29.8 pg      MCHC 34.0 g/dL      RDW 13 %      Platelets 222 x10 3/uL      MPV 10.9 fL      Neutrophils 59 %      Lymphocytes Automated 32 %      Monocytes 7 %      Eosinophils Automated 2 %      Basophils Automated 0 %      Immature Granulocyte 0 %      Nucleated RBC 0      Neutrophils Absolute 3.22 x10 3/uL      Abs Lymph Automated 1.72 x10 3/uL      Abs Mono Automated 0.36 x10 3/uL      Abs Eos Automated 0.09 x10 3/uL      Absolute Baso Automated 0.00 x10 3/uL      Absolute Immature Granulocyte 0.02 x10 3/uL       Radiology Results (24 Hour)     Procedure Component Value Units Date/Time    CT Abd/Pelvis with IV and PO Contrast ([id number]) Collected:10/09/13 2009    Order Status:Completed  Updated:10/09/13 2025    Narrative:    Clinical indications: Abdominal distention and abdominal discomfort.  04/03/2011    TECHNIQUE: CT of the abdomen and pelvis with IV and oral contrast.  Nonionic contrast was utilized.    FINDINGS: The lung bases are clear. Small hiatal hernia.    Hypodensity in the liver, likely a cyst, unchanged. Spleen, adrenal  glands pancreas and kidneys are normal except for right lower pole renal  cyst. Patient status post cholecystectomy.    Stomach is mildly distended with oral contrast and appears unremarkable.  There is no bowel obstruction. Moderate stool is present in the colon.  No bowel wall thickening is identified. Normal appendix is visualized.    Patient is status post hysterectomy. Vaginal cuff is unremarkable. No  adnexal masses are seen. Bladder partially distended.    Aorta is normal in caliber atherosclerotic calcification. There is no  adenopathy or ascites. Degenerative changes are present in the lumbar  spine.      Impression:      1. No localizing inflammatory changes to correlate with the patient's  pain. Normal appendix.  2. Other findings as above.    Jasmine December  D'Heureux, MD    10/09/2013 8:21 PM      Findings: no obx per CT, no colitis per CT, no pancreatitis per lipase, no Ca per CT    Re-evaluation @ 8:57 PM: pt is currently pain free.  Will have follow up with GI on March 2 as already scheduled and take Prilosec OTC until seen.          Amount and/or Complexity  of Data Reviewed  Clinical lab tests: ordered and reviewed  Tests in the radiology section of CPT: ordered and reviewed  Discuss the patient with other providers: yes  Independent visualization of images, tracings, or specimens: yes    Risk of Complications, Morbidity, and/or Mortality  Presenting problems: moderate  Diagnostic procedures: moderate  Management options: low    Patient Progress  Patient progress: improved        Procedures    Clinical Impression & Disposition     Clinical Impression  Final diagnoses:   Abdominal pain        ED Disposition     Discharge Anna Marks discharge to home/self care.    Condition at disposition: Stable             New Prescriptions    No medications on file        Treatment Team: Scribe: Rubaharan, Myurajan  ____________________________________________________________________    I was acting as a scribe for Gerald Dexter, MD on [facility] J  Treatment Team: Scribe: Kavin Leech     I am the first provider for this patient and I personally performed the services documented. Treatment Team: Scribe: Kavin Leech is scribing for me on Marks,Kasen J. This note accurately reflects work and decisions made by me.  Gerald Dexter, MD  ______________________  ______________________________________________     Gerald Dexter, MD  10/09/13 [phone]

## 2014-01-16 DIAGNOSIS — N390 Urinary tract infection, site not specified: Secondary | ICD-10-CM | POA: Diagnosis not present

## 2014-02-01 DIAGNOSIS — K589 Irritable bowel syndrome without diarrhea: Secondary | ICD-10-CM | POA: Diagnosis not present

## 2014-03-04 DIAGNOSIS — K589 Irritable bowel syndrome without diarrhea: Secondary | ICD-10-CM | POA: Diagnosis not present

## 2014-03-11 DIAGNOSIS — E782 Mixed hyperlipidemia: Secondary | ICD-10-CM | POA: Diagnosis not present

## 2014-03-11 DIAGNOSIS — I10 Essential (primary) hypertension: Secondary | ICD-10-CM | POA: Diagnosis not present

## 2014-03-19 DIAGNOSIS — I658 Occlusion and stenosis of other precerebral arteries: Secondary | ICD-10-CM | POA: Diagnosis not present

## 2014-03-22 DIAGNOSIS — Z1231 Encounter for screening mammogram for malignant neoplasm of breast: Secondary | ICD-10-CM | POA: Diagnosis not present

## 2014-04-04 DIAGNOSIS — I10 Essential (primary) hypertension: Secondary | ICD-10-CM | POA: Diagnosis not present

## 2014-04-04 DIAGNOSIS — E782 Mixed hyperlipidemia: Secondary | ICD-10-CM | POA: Diagnosis not present

## 2014-04-04 DIAGNOSIS — M542 Cervicalgia: Secondary | ICD-10-CM | POA: Diagnosis not present

## 2014-04-04 DIAGNOSIS — I6529 Occlusion and stenosis of unspecified carotid artery: Secondary | ICD-10-CM | POA: Diagnosis not present

## 2014-05-07 ENCOUNTER — Inpatient Hospital Stay: Payer: Medicare Other | Attending: Family Medicine | Admitting: Rehabilitative and Restorative Service Providers"

## 2014-05-07 VITALS — BP 181/68 | HR 50

## 2014-05-07 DIAGNOSIS — M542 Cervicalgia: Secondary | ICD-10-CM | POA: Diagnosis not present

## 2014-05-07 DIAGNOSIS — M25519 Pain in unspecified shoulder: Secondary | ICD-10-CM | POA: Diagnosis not present

## 2014-05-07 NOTE — PT/OT Therapy Note (Signed)
INITIAL EVALUATION (Cervical)    Anna Marks Name: Anna Marks    Name: Anna Marks Age: 80 y.o. Occupation: retired SOC: 05/07/2014  Referring Physician: Alma Friendly, MD MD recheck: as needed WGN:FAOZ   DOI: Onset of Problem / Injury: 12/05/13  # of Authorized Visits:   Visit #      Diagnosis (Treating/Medical):     ICD-9-CM    1. Cervical pain 723.1         SUBJECTIVE:    Mechanism of Injury: Anna reports history of neck and R upper arm pain since April 2015.  She feels it may be associated with a Yoga class she tried as it seemed to start the day after a class.    [name]: alleviate pain and increase use of R UE    Past Medical History:   Past Medical History   Diagnosis Date   . Hypertension    . Breast cancer      Medications: aspirin EC 81 MG EC tablet;  Multiple Vitamins-Minerals (MULTIVITAMIN WITH MINERALS) tablet;  niacin (NIASPAN) 500 MG CR tablet;  Probiotic Product (ALIGN PO);  vitamin D (CHOLECALCIFEROL) 400 UNIT tablet   Fall Risks: Low    Other Treatment/Prior Therapy: No  Prior Hospitalization: No  Hand Dominance: Dominant Hand: Right Involved Side: Involved Side: Right   DiagnosticTests: Xrays taken: per patient "pinched nerve"    Outcome Measure:             NDI Score: 26      % PSFS Score: 57%  /10  PLOF: performs her own chores and shopping.  She has slept with 2 pillows due to acid reflux but now she doesn't have acid reflux but still as 2 pillows.  Works out in a Immunologist days a week- includes light weight  Living Environment: Type of Residence: Medical sales representative: # Steps to Enter: 5   Patient lives with: Living Arrangements: Spouse/significant other    OBJECTIVE:    Vitals: BP: 181/68 mmHg Heart Rate: (!) 50 Comments: she reports monitoring at home and the systolic reading is normally ~120    Observation/Posture/Gait/Integumentary  Observation: independent transfers  Posture: Deficits noted: Forward Head, Rounded Shoulders, increased Thoracic  Kyphosis and Other: R shoulder and scapula lower than L, R Iliac crest low  Gait: Other: decreased pelvic rotation  Integumentary: No wound, lesion or rash noted    AROM: Cervical Spine   Initial      Flexion 55      Extension 15       R L R L   Rotation 50 60     Side Bending 10 10     (blank fields were intentionally left blank)    InitialRight  AROM   Right  PROM Shoulder  Other: see below InitialLeft AROM   Left AROM   140 155 Flexion 140      Extension     145  Abduction 150    T10  Internal Rotation T9    70 75 External Rotation     (blank fields were intentionally left blank)    Repeated flexion/extension centralizes symptoms? N/A    STRENGTH   Cervical   MMT /5    IE R IE L R L  IE R IE L R  L   C1/2 Neck Flex     C7 Wrist Flex 5 5     Neck Ext  C7 Wrist Ext 5 5     C3 Neck Sidebend     C4 Upper Trap 5 5     Neck Rotation     C4 Mid Trap       Shoulder Flex 3+ 5   C4 Lower Trap       Shoulder Ext     Rhomboid       C5 Shld Abd 3+ 5   Serratus       C6 Biceps 4 5   Shoulder IR 5 5     C7 Triceps 5 5   Shoulder ER 4- 5     (blank fields were intentionally left blank)     Initial   Right Right Dynamometer Strength Initial   Left Left   40lbs  Grip 40lbs    (blank fields were intentionally left blank)    Palpation: Muscle tone: Other: tightness in cervical PVM and thoracic PVM B, B UT and levator scapulae, scalenes, pec mm B    Joint Mobility Assessment: hypomobile thoracic PA glides, cervical sideglides and PA glides throughout End feel: Firm    Sensation to Light touch: Intact    Special Tests/Neurological Screen:   R L   Deep Tendon Reflexes: Biceps 2+ = Normal 2+ = Normal    Brachioradialis 2+ = Normal 2+ = Normal    Triceps 2+ = Normal 2+ = Normal      R L   Spurling NT NT   Distraction Test (-) (-)   Upper Limb Tension Test NT NT   Thoracic Outlet NT NT   Deep Neck Flexor Endurance NT NT   Quadrant Test NT NT          Treatment Initial Visit:  Evaluation  Patient Education on plan of care  Therapeutic  exercise with instruction in HEP and provided patient written and illustrated handout Yes scapular retraction active standing 5-10 hourly during the day (with TA)  Therapeutic Activity: education in postural alignment for standing with elongated neck.  Educated to imagine being 1-2 inches taller than she is and elongate her spine toward the ceiling from the back of her neck.  Education on sleep position in S/L or supine- worked on understanding neutral positon for neck and filling in empty space at neck.  Nose in line with sternum.  Education and practice of sit to supine and reverse for bed mobility with reduced pain/stress on neck.  Manual: N/A  Modalities: Hot Pack 10 min. Location cervical spine Position Supine 90/90  Barriers to rehabilitation: None  Rehab Potential:good  Is patient aware of diagnosis: Yes  For Next Visit Add manual techniques to loosen cervical PVM, scalenes, grade 1-2 mobs of neck, advance HEP- trial retraction, shoulder press, cervical rotation, Anna Marks, Anna Anna Marks  05/07/2014    Time In/Out:  10:00 - 11:00 Total Treatment Time:  3  Plan of Care / Updated Plan of Care IPTC Medicare Provider #: [phone]                Patient Name: Anna Marks  MRN: [id number]  [facility] [place] Medicare #: Medicare Sub. Num: [id number]  DOI: Onset of Problem / Injury: 12/05/13 DOS: N/A  SOC: 05/07/2014     Diagnosis:     ICD-9-CM    1. Cervical pain 723.1          G Codes: Evaluation / Current: U1324 Carry (Objects) CJ (20-40) Goal: M0102 Carry (Objects) CI (1-20)  Primary Functional  Deficit: Carrying, Moving and Handling Objects G-codes determined by: NDI, shoulder weakness, ROM and professional judgement    ASSESSMENT: the patient is a 80 y.o. female presenting with neck and R upper arm pain who requires Physical Therapy for the following:  Impairments: loss of cervical and upper thoracic joint mobility, loss of cervical AROM (especially extension), mild loss of R shoulder AROM, weakness of R  shoulder, postural imbalances and soft tissue restrictions    Functional Limitations: Pain in neck and R upper arm- fluctuates in degree.She has trouble carrying things in the R arm- she has to brace the arm to her side to stabilize it.  She can't lift weight overhead now.  She can pick up about 2 lbs now.She goes to the gym 2-4 times a week.- light weight/work out class aimed at seniors.  She has been sleeping with 2 pillows at home- a habit started when she had acid reflux.  She wakes with neck stiffness. Limited rotation to the right. LImited ability to look up.   Vacuum use is limited by pain.  No numbness, tingling noted.    Plan Of Care: Body Mechanics Education, NMR, Statistician, Instruction in HEP, Therapeutic Exercise and Soft Tissue/Joint Mobilization release scalenes, UTs, LS, cervical /thoracic PVM, Gentle mobs of cervical spine Grade 1-2 to start for ROM increases- especially extension  Frequency/Duration: 2 times a week for 7 weeks. Anticipated D/C date: 06/18/14    Certification Dates: From: 05/07/14  To: 06/18/14    Goals:   End of Certification   Date (Body Area, Impairment Goal, Functional   Activity, Target Performance) Time Frame Status Date/  Initial   05/07/2014   Increase active cervical rotation to 65  degrees for increased visual field to allow safe changing lanes and backing up vehicle while driving.  06/18/14 Initial Eval     05/07/2014   Improve NDI to less than 20%  06/18/14 Initial Eval    05/07/2014   Improve R shoulder flexion, abduction and ER strength to 4/5 or better to be able to lift 2 lbs to overhead shelf.  06/18/14 Initial Eval    05/07/2014   Improve cervical active extension to 45 deg to be able to look to overhead shelf/item without pain increase.  06/18/14 Initial Eval    05/07/2014   Patient will demonstrate independence in prescribed HEP with proper form, sets and reps for safe discharge to an independent program.  06/18/14 Initial Eval      Signature: Anna Marks, Anna Marks  Date: 05/07/2014    Signature: Alma Friendly, MD ___________________________ Date:     Patient Name: Anna Marks  MRN: [id number]

## 2014-05-08 ENCOUNTER — Encounter: Payer: Self-pay | Admitting: Rehabilitative and Restorative Service Providers"

## 2014-05-08 DIAGNOSIS — M542 Cervicalgia: Secondary | ICD-10-CM | POA: Insufficient documentation

## 2014-05-09 ENCOUNTER — Inpatient Hospital Stay: Payer: Medicare Other | Admitting: Rehabilitative and Restorative Service Providers"

## 2014-05-09 DIAGNOSIS — M542 Cervicalgia: Secondary | ICD-10-CM | POA: Diagnosis not present

## 2014-05-09 DIAGNOSIS — M25519 Pain in unspecified shoulder: Secondary | ICD-10-CM | POA: Diagnosis not present

## 2014-05-09 NOTE — PT/OT Therapy Note (Signed)
DAILY NOTE  05/09/2014    Time In/Out: 10:22 - 11:20 Total Time: 55 Visit Number:  2    Certification Status Ends: June 22, 2014   G-Codes required on visit # 10    # of Authorized Visits:   Visit #:        Diagnosis (Treating/Medical):     ICD-9-CM    1. Cervical Marks 723.1            Subjective:  Anna Marks is in R side neck and upper arm.  Functional Changes:she started her beginning HEP.      Objective:   Treatment:  Therapeutic Exercise: to improve: Flexibility/ROM and Strength   Modifications/Patient Education: added to HEP with handout (cervical OA nod supine, cervical rotation supine, nose circles for cervical AROM 5-10 each way - 2 times a day (Verbal, Visual and Tactile cues to improve form- primarily with the new ex)   Cervical nod supine with pillow and towel roll support 10 reps   Cervical supine nose clock 5 reps each way   Cervical rotation in supine 10 each way   Scapular retraction standing 10 reps   Instructed to avoid overhead activity in exercise class   Yellow TB ER 10 reps R shoulder   Yellow TB R shoulder extension to neutral 10 reps   Yellow TB retraction 10 reps B standing   Trial cervical retraction standing 5 reps - did not prescribe at home yet to assess tolerance   Stretch shoulder flexion by PT and cane AAROM flexion 5 reps supine    Therapeutic Activities:  Practiced sit to supine and reverse again.  (with there ex )    Manual Therapy:   STMob in cervical PVM, sub occipitals (gently), B UT and Levator scapulae; Gently manual distraction of neck.  Grade 2 Central PA glides upper thoracic spine and side glides of cervical spine. Stretch of R 1st rib with breath pattern.      Current Measurements (ROM, Strength, Girth, Outcomes, etc.):   palpation:   Mild restriction of R 1st rib    Modalities: Electrical Stimulation with Heat: Premod 15 min. Location R UT and R cervical PVM Position Supine- LEs supported  Therapy Rationale: Decrease Marks, Decrease Spasm and Increase  Extensiblility       Assessment (response to treatment):   Stiff joint mobility in neck and upper thoracic spine.  Still needs cues with transfer form    Progress towards functional goals: starting HEP to meet goals    Patient requires continued skilled care to: improve cervical AROM and shoulder strength to improve function to lift items in home and on errands    Plan:  Adjustments/Modifications: progress HEP to light shoulder strength ex when tolerated    Druscilla Brownie, PT [place] [id number]  05/09/2014    Certification Dates: From: 05/07/14  To: 22-Jun-2014    Goals:   End of Certification    Date  (Body Area, Impairment Goal, Functional   Activity, Target Performance)  Time Frame  Status  Date/  Initial    05/07/2014  Increase active cervical rotation to 65  degrees for increased visual field to allow safe changing lanes and backing up vehicle while driving.  06-22-14  Initial Eval     05/07/2014  Improve NDI to less than 20%  06-22-14  Initial Eval     05/07/2014  Improve R shoulder flexion, abduction and ER strength to 4/5 or better to be able to lift 2 lbs to overhead shelf.  June 22, 2014  Initial Eval     05/07/2014  Improve cervical active extension to 45 deg to be able to look to overhead shelf/item without Marks increase.  06/18/14  Initial Eval     05/07/2014  Patient will demonstrate independence in prescribed HEP with proper form, sets and reps for safe discharge to an independent program.

## 2014-05-13 ENCOUNTER — Inpatient Hospital Stay: Payer: Medicare Other

## 2014-05-13 DIAGNOSIS — M542 Cervicalgia: Secondary | ICD-10-CM | POA: Diagnosis not present

## 2014-05-13 DIAGNOSIS — M25519 Pain in unspecified shoulder: Secondary | ICD-10-CM | POA: Diagnosis not present

## 2014-05-13 NOTE — PT/OT Therapy Note (Signed)
DAILY NOTE  05/13/2014    Time In/Out: 245-340  Total Time: 55 Visit Number:  3    Certification Status Ends: 2014-07-17   G-Codes required on visit # 10    # of Authorized Visits: 14 Visit #: 3      Diagnosis (Treating/Medical):     ICD-9-CM    1. Cervical pain 723.1            Subjective:  Anna Marks's reports she was sore after last visit, and pain may be worse now.  Functional Changes: sleeping is a challenge due to pain     Objective:   Treatment:  Therapeutic Exercise: to improve: Flexibility/ROM and Strength   Modifications/Patient Education  - (Verbal, Visual and Tactile cues to improve form- primarily with the new ex)   Cervical nod supine with pillow and towel roll support 10 reps   Cervical supine nose clock 5 reps each way   Cervical rotation in supine 10 each way   Scapular retraction standing 10 reps   Instructed to avoid overhead activity in exercise class- did not do this date   red TB ER 10 reps R shoulder   redTB R shoulder extension to neutral 10 reps   red TB retraction 10 reps B standing   Trial cervical retraction standing 5 reps - did not prescribe at home yet to assess tolerance--did not do this date   Stretch shoulder flexion by PT and cane AAROM flexion 5 reps supine    Therapeutic Activities:  na    Manual Therapy:   STMob in cervical PVM, sub occipitals (gently),   B UT and Levator scapulae;   Gently manual distraction of neck.    Grade 2 Central PA glides upper thoracic spine and side glides of cervical spine.   Stretch of R 1st rib with breath pattern- gentle      Current Measurements (ROM, Strength, Girth, Outcomes, etc.):       Modalities: Electrical Stimulation with Heat: Premod 15 min. Location R UT and R cervical PVM Position Supine- LEs supported  Therapy Rationale: Decrease Pain, Decrease Spasm and Increase Extensiblility       Assessment (response to treatment):   1st rib on (R) very restricted, but too painful to fully mob.  Small bruise at (R) elbow and patient does no know  why and thinks it may be because of therapy. Increase tband to red and patient tolerated well.    Progress towards functional goals: starting HEP to meet goals    Patient requires continued skilled care to: improve cervical AROM and shoulder strength to improve function to lift items in home and on errands    Plan:  Continue with Plan of Care    Nestor Ramp  License #[id number]    05/13/2014    Certification Dates: From: 05/07/14  To: Jul 17, 2014    Goals:   End of Certification    Date  (Body Area, Impairment Goal, Functional   Activity, Target Performance)  Time Frame  Status  Date/  Initial    05/07/2014  Increase active cervical rotation to 65  degrees for increased visual field to allow safe changing lanes and backing up vehicle while driving.  07/17/2014  Initial Eval     05/07/2014  Improve NDI to less than 20%  07-17-14  Initial Eval     05/07/2014  Improve R shoulder flexion, abduction and ER strength to 4/5 or better to be able to lift 2 lbs to overhead shelf.  2014-07-17  Initial Eval     05/07/2014  Improve cervical active extension to 45 deg to be able to look to overhead shelf/item without pain increase.  06/18/14  Initial Eval     05/07/2014  Patient will demonstrate independence in prescribed HEP with proper form, sets and reps for safe discharge to an independent program.

## 2014-05-14 DIAGNOSIS — Z23 Encounter for immunization: Secondary | ICD-10-CM | POA: Diagnosis not present

## 2014-05-14 DIAGNOSIS — I809 Phlebitis and thrombophlebitis of unspecified site: Secondary | ICD-10-CM | POA: Diagnosis not present

## 2014-05-15 ENCOUNTER — Inpatient Hospital Stay: Payer: Medicare Other | Admitting: Rehabilitative and Restorative Service Providers"

## 2014-05-15 DIAGNOSIS — M542 Cervicalgia: Secondary | ICD-10-CM | POA: Diagnosis not present

## 2014-05-15 DIAGNOSIS — M25519 Pain in unspecified shoulder: Secondary | ICD-10-CM | POA: Diagnosis not present

## 2014-05-15 NOTE — PT/OT Therapy Note (Signed)
DAILY NOTE  05/15/2014    Time In/Out: 12:07 - 1:11  Total Time: 57 Visit Number:  4    Certification Status Ends: 2014-06-23   G-Codes required on visit # 10    # of Authorized Visits: 14 Visit #: 4      Diagnosis (Treating/Medical):     ICD-9-CM    1. Cervical pain 723.1            Subjective:  Anna Marks's reports she talked to her doctor about her forearm bruise and she is taking Aleve for it for a week.  The Aleve seemed to help her neck.  Functional Changes: not much pain this am with Aleve use    Objective:   Treatment:  Therapeutic Exercise: to improve: Flexibility/ROM and Strength   Warm up with UBE alternating directions 4 min total  Modifications/Patient Education  - (Verbal, Visual and Tactile cues to improve form- corrections with all TB ex and motion exs)   Cervical nod supine with pillow support 10 reps   Cervical supine nose clock 10 reps each way   Cervical rotation in supine 10 each way   Scapular retraction standing 10 reps   B shoulder flexion red tubing 10 reps    red TB ER 10 reps R shoulder   redTB R shoulder extension to neutral 10 reps   red TB retraction 10 reps B standing   Towel slide up mirror 10 reps with towel AAROM     Therapeutic Activities:  na    Manual Therapy:   STMob in cervical PVM, sub occipitals (gently),  B UT and Levator scapulae;   Gently manual distraction of neck.    Grade 2 Central PA glides upper thoracic spine and side glides of cervical spine.   Stretch of R 1st rib with breath pattern- gentle      Current Measurements (ROM, Strength, Girth, Outcomes, etc.):     MMT shoulder flexion R 4/5    Modalities: Electrical Stimulation with Heat: Premod 15 min. Location R UT and R cervical PVM Position Supine- LEs supported  Therapy Rationale: Decrease Pain, Decrease Spasm and Increase Extensiblility       Assessment (response to treatment):   Stiff cervical and thoracic spine with forward head posture- cues needed to lengthen spine with ex; Improved shoulder flexion test  (MMT)  Progress towards functional goals: Better force production/ MMT with shoulder flexion test today    Patient requires continued skilled care to: improve cervical AROM and shoulder strength to improve function to lift items in home and on errands    Plan:  Continue with Plan of Care; continue to work on increase mobility of neck/upper back and postural / RC strength    Druscilla Brownie, PT [place] [address]  05/15/2014    Certification Dates: From: 05/07/14  To: 2014-06-23    Goals:   End of Certification    Date  (Body Area, Impairment Goal, Functional   Activity, Target Performance)  Time Frame  Status  Date/  Initial    05/07/2014  Increase active cervical rotation to 65  degrees for increased visual field to allow safe changing lanes and backing up vehicle while driving.  June 23, 2014  Initial Eval     05/07/2014  Improve NDI to less than 20%  2014/06/23  Initial Eval     05/07/2014  Improve R shoulder flexion, abduction and ER strength to 4/5 or better to be able to lift 2 lbs to overhead shelf.  2014-06-23  Initial Eval  05/07/2014  Improve cervical active extension to 45 deg to be able to look to overhead shelf/item without pain increase.  06/18/14  Initial Eval     05/07/2014  Patient will demonstrate independence in prescribed HEP with proper form, sets and reps for safe discharge to an independent program.

## 2014-05-21 ENCOUNTER — Inpatient Hospital Stay: Payer: Medicare Other | Admitting: Rehabilitative and Restorative Service Providers"

## 2014-05-21 DIAGNOSIS — M542 Cervicalgia: Secondary | ICD-10-CM | POA: Diagnosis not present

## 2014-05-21 DIAGNOSIS — M25519 Pain in unspecified shoulder: Secondary | ICD-10-CM | POA: Diagnosis not present

## 2014-05-21 NOTE — PT/OT Therapy Note (Signed)
DAILY NOTE  05/21/2014    Time In/Out: 12:20- 1:20 Total Time: 57 Visit Number:  5    Certification Status Ends: 14-Jul-2014   G-Codes required on visit # 10    # of Authorized Visits: 14 Visit #: 5      Diagnosis (Treating/Medical):     ICD-9-CM    1. Cervical pain 723.1            Subjective:  Anna Marks reports her neck pain is a little bit better.  She finished the Aleve yesterday.  Functional Changes: Sleeping with one pillow and a towel roll- getting used to it.  Pillow between her knees.    Objective:   Treatment:  Therapeutic Exercise: to improve: Flexibility/ROM and Strength   Warm up with UBE alternating directions 4 min total  Modifications/Patient Education  - (Verbal, Visual and Tactile cues to improve form- corrections with all TB ex and motion exs)   Cervical nod supine with pillow support 10 reps   Cervical supine nose clock 10 reps each way   Cervical rotation in supine 10 each way   Scapular retraction standing 10 reps   B shoulder flexion red tubing 10 reps (not today)   red TB ER 10 reps R shoulder   redTB R shoulder extension to neutral 10 reps   red TB retraction 10 reps B standing   Towel slide up mirror 10 reps with towel AAROM    Yellow TB horizotnal abduction supine 10 each side   Eccentric flexion with yellow TB 10 reps supine    Therapeutic Activities:  na    Manual Therapy:   STMob in cervical PVM, sub occipitals (gently),  B UT and Levator scapulae;   Gently manual distraction of neck.    Grade 2 Central PA glides upper thoracic spine   Stretch of R 1st rib with breath pattern- gentle      Current Measurements (ROM, Strength, Girth, Outcomes, etc.):     MMT shoulder flexion R 4/5  AROM cervical rotation 68 deg B    Modalities: Electrical Stimulation with Heat: Premod 15 min. Location R UT and R cervical PVM Position Supine- LEs supported  Therapy Rationale: Decrease Pain, Decrease Spasm and Increase Extensiblility       Assessment (response to treatment):   Glenohumeral joint  restriction limits flexion tolerance of shoulder.  Improved cervical AROM measurements.  Cues for reducing forward head needed.    Progress towards functional goals:improved rotation mobility of neck today    Patient requires continued skilled care to: improve cervical AROM and shoulder strength to improve function to lift items in home and on errands    Plan:  Continue with Plan of Care; continue to work on increase mobility of neck/upper back and postural / RC strength    Anna Marks, PT [place] [address]  05/21/2014    Certification Dates: From: 05/07/14  To: 07/14/2014    Goals:   End of Certification    Date  (Body Area, Impairment Goal, Functional   Activity, Target Performance)  Time Frame  Status  Date/  Initial    05/07/2014  Increase active cervical rotation to 65  degrees for increased visual field to allow safe changing lanes and backing up vehicle while driving.  07-14-14  Objective improvement KR 05/21/14   05/07/2014  Improve NDI to less than 20%  2014/07/14  Initial Eval     05/07/2014  Improve R shoulder flexion, abduction and ER strength to 4/5 or better to be  able to lift 2 lbs to overhead shelf.  06/18/14  Initial Eval     05/07/2014  Improve cervical active extension to 45 deg to be able to look to overhead shelf/item without pain increase.  06/18/14  Initial Eval     05/07/2014  Patient will demonstrate independence in prescribed HEP with proper form, sets and reps for safe discharge to an independent program.

## 2014-05-23 ENCOUNTER — Inpatient Hospital Stay: Payer: Medicare Other | Attending: Family Medicine | Admitting: Rehabilitative and Restorative Service Providers"

## 2014-05-23 DIAGNOSIS — M542 Cervicalgia: Secondary | ICD-10-CM | POA: Diagnosis not present

## 2014-05-23 NOTE — PT/OT Therapy Note (Signed)
DAILY NOTE  05/23/2014    Time In/Out: 11:47- 12:55 Total Time: 60 Visit Number:  6    Certification Status Ends: 06-28-14   G-Codes required on visit # 10    # of Authorized Visits: 14 Visit #: 5      Diagnosis (Treating/Medical):     ICD-10-CM    1. Cervical pain M54.2            Subjective:  Jenene's reports her neck pain is 5/10 in R UT shoulder and neck today.  Functional Changes: Her symptoms were improved after last visit but then she cleaned her stove (yesterday) and that increased her pain again when going to bed.    Objective:   Treatment:  Therapeutic Exercise: to improve: Flexibility/ROM and Strength   Warm up with UBE alternating directions 4 min total- subjective intake  Modifications/Patient Education  - (Verbal, Visual and Tactile cues to improve form- corrections with all TB ex and motion exs)   Cervical nod supine with pillow support 10 reps   Cervical supine nose clock 10 reps each way   Cervical rotation in supine 10 each way   Scapular retraction standing 10 reps   B shoulder flexion red tubing 10 reps    red TB ER 10 reps R shoulder   redTB B shoulder extension to neutral 10 reps   red TB retraction 10 reps B standing   Yellow TB horizotnal abduction supine 10 each side   Eccentric flexion with yellow TB 10 reps supine   Shoulder stretch with cane (AAROM) 10 reps  Pt purchased red TB for home use.  Provided instruction in HEP of cane AAROM flexion 10 reps, standing B retraction red TB 10-15, ER red TB 10-15 reps, B shoulder extension red TB 10-15 reps daily- build reps as tolerated.    Therapeutic Activities:  na    Manual Therapy:   STMob in cervical PVM, sub occipitals (gently),  B UT and Levator scapulae;   Gently manual distraction of neck.    Grade 2 Central PA glides upper thoracic spine   Stretch of R 1st rib with breath pattern- gentle      Current Measurements (ROM, Strength, Girth, Outcomes, etc.):     -    Modalities: Electrical Stimulation with Heat: Premod 15 min.  Location R UT and R cervical PVM Position Supine- LEs supported  Therapy Rationale: Decrease Pain, Decrease Spasm and Increase Extensiblility       Assessment (response to treatment):   Positive response to last visit.  Cervical and upper thoracic spine is stiff with forward head.  Cues still needed to elongate spine for improved posture.    Progress towards functional goals:-    Patient requires continued skilled care to: improve cervical AROM and shoulder strength to improve function to lift items in home and on errands    Plan:  Continue with Plan of Care; Check form and tolerance with HEP progression    Druscilla Brownie, PT [place] [address]  05/23/2014    Certification Dates: From: 05/07/14  To: 28-Jun-2014    Goals:   End of Certification    Date  (Body Area, Impairment Goal, Functional   Activity, Target Performance)  Time Frame  Status  Date/  Initial    05/07/2014  Increase active cervical rotation to 65  degrees for increased visual field to allow safe changing lanes and backing up vehicle while driving.  06-28-14  Objective improvement KR 05/21/14   05/07/2014  Improve NDI to  less than 20%  06/18/14  Initial Eval     05/07/2014  Improve R shoulder flexion, abduction and ER strength to 4/5 or better to be able to lift 2 lbs to overhead shelf.  06/18/14  Progress 4/5 flexion MMT KR 05/23/14   05/07/2014  Improve cervical active extension to 45 deg to be able to look to overhead shelf/item without pain increase.  06/18/14  Initial Eval     05/07/2014  Patient will demonstrate independence in prescribed HEP with proper form, sets and reps for safe discharge to an independent program.   Pro

## 2014-05-27 ENCOUNTER — Inpatient Hospital Stay: Payer: Medicare Other

## 2014-05-27 DIAGNOSIS — M542 Cervicalgia: Secondary | ICD-10-CM | POA: Diagnosis not present

## 2014-05-27 NOTE — PT/OT Therapy Note (Signed)
DAILY NOTE  05/27/2014    Time In/Out: 115- 215 Total Time: 60 Visit Number:  7    Certification Status Ends: 07/08/2014   G-Codes required on visit # 10    # of Authorized Visits: 14 Visit #: 7    Diagnosis (Treating/Medical):     ICD-10-CM    1. Cervical pain M54.2            Subjective:  Anna Marks reports her pain in about a 5/10, the rainy weekend flare my pain  Functional Changes: Still challenged by reaching    Objective:   Treatment:  Therapeutic Exercise: to improve: Flexibility/ROM and Strength   Warm up with UBE alternating directions 5 min total- subjective intake  Modifications/Patient Education  - (Verbal, Visual and Tactile cues to improve form- corrections with all TB ex and motion exs)   Cervical nod supine with pillow support 10 reps   Cervical supine nose clock 10 reps each way   Cervical rotation in supine 10 each way   Scapular retraction standing 10 reps   B shoulder flexion red tubing 10 reps    red TB ER 10 reps R shoulder   redTB B shoulder extension to neutral 10 reps   red TB retraction 10 reps B standing   Yellow TB horizotnal abduction supine 10 each side   Eccentric flexion with yellow TB 10 reps supine   Shoulder stretch with cane (AAROM) 10 reps      Therapeutic Activities:  na    Manual Therapy:   STMob in cervical PVM, sub occipitals (gently),  (B) UT and Levator scapulae;   Gently manual distraction of neck.    Grade 2 Central PA glides upper thoracic spine   Stretch of R 1st rib with breath pattern- gentle      Current Measurements (ROM, Strength, Girth, Outcomes, etc.):     -    Modalities: Electrical Stimulation with Heat: Premod 15 min. Location R UT and R cervical PVM Position Supine- LEs supported  Therapy Rationale: Decrease Pain, Decrease Spasm and Increase Extensiblility       Assessment (response to treatment):   Continues to have (R) UT spasm and limited ROM in (R) shoulder.  Over all tolerance to exercises has improved. Pt seemed to have good knowledge of  HEP.    Progress towards functional goals: learning HEP    Patient requires continued skilled care to: improve cervical AROM and shoulder strength to improve function to lift items in home and on errands    Plan:  Continue with Plan of Care;     Nestor Ramp  License #[id number]    05/27/2014    Certification Dates: From: 05/07/14  To: July 08, 2014    Goals:   End of Certification    Date  (Body Area, Impairment Goal, Functional   Activity, Target Performance)  Time Frame  Status  Date/  Initial    05/07/2014  Increase active cervical rotation to 65  degrees for increased visual field to allow safe changing lanes and backing up vehicle while driving.  2014/07/08  Objective improvement KR 05/21/14   05/07/2014  Improve NDI to less than 20%  07-08-2014  Initial Eval     05/07/2014  Improve R shoulder flexion, abduction and ER strength to 4/5 or better to be able to lift 2 lbs to overhead shelf.  07-08-2014  Progress 4/5 flexion MMT KR 05/23/14   05/07/2014  Improve cervical active extension to 45 deg to be able to look  to overhead shelf/item without pain increase.  06/18/14  Initial Eval     05/07/2014  Patient will demonstrate independence in prescribed HEP with proper form, sets and reps for safe discharge to an independent program.   Pro

## 2014-05-31 ENCOUNTER — Inpatient Hospital Stay: Payer: Medicare Other

## 2014-05-31 DIAGNOSIS — M542 Cervicalgia: Secondary | ICD-10-CM | POA: Diagnosis not present

## 2014-05-31 NOTE — PT/OT Therapy Note (Signed)
DAILY NOTE  05/31/2014    Time In/Out: 11:15-12:15 Total Time: 60 Visit Number:  8    Certification Status Ends: 07-05-2014   G-Codes required on visit # 10    # of Authorized Visits: 14 Visit #: 7    Diagnosis (Treating/Medical):     ICD-10-CM    1. Cervical pain M54.2            Subjective:  Anna Marks's reports her pain can get to 6/10 but not constant, primarily with reaching.  Functional Changes:she reports no issues driving    Objective:   Treatment:  Therapeutic Exercise: to improve: Flexibility/ROM and Strength   Warm up with UBE alternating directions 5 min total- subjective intake  Modifications/Patient Education  - (Verbal, Visual and Tactile cues to improve form- corrections with all TB ex and motion exs)   Cervical nod supine with pillow support 10 reps   Cervical supine nose clock 10 reps each way   Cervical rotation in supine 10 each way   Scapular retraction standing 10 reps   B shoulder flexion red tubing 10 reps    red TB ER 10 reps R shoulder (towel at elbow)   redTB B shoulder extension to neutral 10 reps   red TB retraction 10 reps B standing   Yellow TB horizotnal abduction supine 10 each side   Eccentric flexion with yellow TB 10 reps supine   Shoulder stretch with cane (AAROM) 10 reps      Therapeutic Activities:  na    Manual Therapy: (Gentle)  STMob in cervical PVM, sub occipitals  (B) UT and Levator scapulae;   Manual distraction of neck.             Current Measurements (ROM, Strength, Girth, Outcomes, etc.):     -    Modalities: Electrical Stimulation with trial ice pack: Premod 15 min. Location R UT and R cervical PVM Position Supine- LEs supported, towel under R elbow  Therapy Rationale: Decrease Pain, Decrease Spasm and Increase Extensiblility       Assessment (response to treatment):   Trial ice to address radicular symptoms.  She prefers heat but understood reasoning behind trial and was willing to try.    Progress towards functional goals: becoming indep with HEP    Patient  requires continued skilled care to: improve cervical AROM and shoulder strength to improve function to lift items in home and on errands    Plan:  Continue with Plan of Care; Measure active extension to assess goal status.    [name], [place]  [id number]    05/31/2014    Certification Dates: From: 05/07/14  To: 2014-07-05    Goals:   End of Certification    Date  (Body Area, Impairment Goal, Functional   Activity, Target Performance)  Time Frame  Status  Date/  Initial    05/07/2014  Increase active cervical rotation to 65  degrees for increased visual field to allow safe changing lanes and backing up vehicle while driving.  05-Jul-2014  Objective improvement KR 05/21/14   05/07/2014  Improve NDI to less than 20%  05-Jul-2014  Initial Eval     05/07/2014  Improve R shoulder flexion, abduction and ER strength to 4/5 or better to be able to lift 2 lbs to overhead shelf.  07-05-2014  Progress 4/5 flexion MMT KR 05/23/14   05/07/2014  Improve cervical active extension to 45 deg to be able to look to overhead shelf/item without pain increase.  Jul 05, 2014  Initial  Eval     05/07/2014  Patient will demonstrate independence in prescribed HEP with proper form, sets and reps for safe discharge to an independent program.   Pro

## 2014-06-06 ENCOUNTER — Inpatient Hospital Stay: Payer: Medicare Other

## 2014-06-06 DIAGNOSIS — M542 Cervicalgia: Secondary | ICD-10-CM | POA: Diagnosis not present

## 2014-06-06 NOTE — PT/OT Therapy Note (Signed)
DAILY NOTE  06/06/2014    Time In/Out: 2:45-3:45 Total Time: 60 Visit Number:  9    Certification Status Ends: June 29, 2014   G-Codes required on visit # 10    # of Authorized Visits: 14 Visit #: 7    Diagnosis (Treating/Medical):     ICD-10-CM    1. Cervical pain M54.2            Subjective:  Anna Marks says she felt good after last visit.  Woke up with stiffness on R of neck.  Functional Changes:feels she is turning her head more easily.    Objective:   Treatment:  Therapeutic Exercise: to improve: Flexibility/ROM and Strength   Warm up with UBE alternating directions 2 min each way.  Modifications/Patient Education  -   Cervical nod supine with pillow support 10 reps   Cervical supine nose clock 10 reps each way   Cervical rotation in supine 10 each way   Scapular retraction standing 10 reps   B shoulder flexion red tubing 10 reps    red TB ER 10 reps R shoulder (towel at elbow)   redTB B shoulder extension to neutral 10 reps   red TB retraction 10 reps B standing   Red TB horizotnal abduction supine 10 each side   Eccentric flexion with yellow TB 10 reps supine   Shoulder stretch with cane (AAROM) 10 reps      Therapeutic Activities:  na    Manual Therapy:   STMob to cervical paraspinals, sub occipitals  (B) UT and Levator scapulae;   Manual distraction of neck.             Current Measurements (ROM, Strength, Girth, Outcomes, etc.):     C-rotation AROM:  R: 55  L: 60    Issued NDI for return next visit.    Modalities: Electrical Stimulation with  ice pack: Premod 15 min. Location R UT and R cervical PVM in Supine- LEs supported, towel under R elbow (holding MHP per request, and with sheet)  Therapy Rationale: Decrease Pain, Decrease Spasm and Increase Extensiblility       Assessment (response to treatment):   Continued with ice (vs heat with ES) since she had good response last visit. Progressing on cervical active rotation.    Progress towards functional goals:     Patient requires continued skilled care  to: improve cervical AROM and shoulder strength to improve function to lift items in home and on errands    Plan:  Continue with Plan of Care  Ask for NDI return.    [name], [place]  #[id number]    06/06/2014    Certification Dates: From: 05/07/14  To: June 29, 2014    Goals:   End of Certification    Date  (Body Area, Impairment Goal, Functional   Activity, Target Performance)  Time Frame  Status  Date/  Initial    05/07/2014  Increase active cervical rotation to 65  degrees for increased visual field to allow safe changing lanes and backing up vehicle while driving.  June 29, 2014  Progress 10/15/15PR   05/07/2014  Improve NDI to less than 20%  2014/06/29  Initial Eval     05/07/2014  Improve R shoulder flexion, abduction and ER strength to 4/5 or better to be able to lift 2 lbs to overhead shelf.  June 29, 2014  Progress 4/5 flexion MMT KR 05/23/14   05/07/2014  Improve cervical active extension to 45 deg to be able to look to overhead shelf/item without pain increase.  06/18/14  Initial Eval     05/07/2014  Patient will demonstrate independence in prescribed HEP with proper form, sets and reps for safe discharge to an independent program.   Pro

## 2014-06-11 ENCOUNTER — Inpatient Hospital Stay: Payer: Medicare Other

## 2014-06-11 DIAGNOSIS — M542 Cervicalgia: Secondary | ICD-10-CM | POA: Diagnosis not present

## 2014-06-11 NOTE — PT/OT Therapy Note (Addendum)
DAILY NOTE  06/11/2014    Time In/Out: 3:15-4:15 Total Time: 60 Visit Number:  10    Certification Status Ends: 2014/06/21   G-Codes required on visit # 10    # of Authorized Visits: 14 Visit #: 7    Diagnosis (Treating/Medical):   No diagnosis found.        Subjective:  Vincent says she felt good after last visit.  Woke up with stiffness on R of neck.  Functional Changes:feels she is turning her head more easily.    Objective:   Treatment:  Therapeutic Exercise: to improve: Flexibility/ROM and Strength   Warm up with UBE alternating directions 2 min each way.  Modifications/Patient Education     Cervical rotation in supine 10 each way   Scapular retraction standing 10 reps   B shoulder flexion red tubing 10 reps    red TB ER 10 reps R shoulder (towel at elbow)   redTB B shoulder extension to neutral 10 reps   red TB retraction 10 reps B standing   Red TB horizotnal abduction supine 10 each side   Eccentric flexion with red TB 10 reps supine   Shoulder stretch with cane (AAROM) 10 reps      Therapeutic Activities:  na    Manual Therapy:   STMob to cervical paraspinals, sub occipitals  (B) UT and Levator scapulae;   Manual distraction of neck.             Current Measurements (ROM, Strength, Girth, Outcomes, etc.):     C-rotation AROM:  Flexion 50 deg, extension 37 deg, Side bend R 35 deg, L 30 deg, rotation R 65 L 60  Evaluation AROM: flexion 55, extension 15, side bend 10 deg B, rotation R 50 L 60    R shoulder strength: flexion 4/5, scapular plane 4/5, ER 4-/5, IR 5/5  (initial MMT flexion and scapular plane 3+/5, ER 4-/5)    NDI 22% (26% at eval)    Modalities: Electrical Stimulation with  ice pack: Premod 15 min. Location R UT and R cervical PVM in Supine- LEs supported, towel under R elbow   Therapy Rationale: Decrease Pain, Decrease Spasm and Increase Extensiblility       Assessment (response to treatment):   Progressing on ext goal (from 15 deg at eval to 37 today) but still limited with overhead  lifting and looking up    Progress towards functional goals: See the box below  G Codes: Evaluation / Current: Z6109 Carry (Objects) CJ (20-40) Goal: U0454 Carry (Objects) CI (1-20)  Primary Functional Deficit: Carrying, Moving and Handling Objects G-codes determined by: NDI, strength, ROM, professional judgement in consultation with PT    Patient requires continued skilled care to: improve cervical AROM and shoulder strength to improve function to lift items in home and on errands    Plan:  Continue with Plan of Care  : increase cervical extension tolerance, rotation L, RC strength; continue postural awareness    Arcangel Minion, LPTA  #[id number]/ Druscilla Brownie, PT [place] [address]    06/11/2014         End of Certification Status:   Service Dates:   From:   05/07/14 To: 06/11/2014  Visits from [facility]: 10    Objective Status:    See note above  G Codes: Evaluation / Current: X9147 Carry (Objects) CJ (20-40) Goal: W2956 Carry (Objects) CI (1-20)  Primary Functional Deficit: Carrying, Moving and Handling Objects G-codes determined by: NDI, strength, ROM, professional judgement in consultation  with PT    Recommendations:   Extend Original "Plan of Care" to address the above impairments / functional limitations.  Justification for extension: Goals not met but are partially met    Signature: Druscilla Brownie, PT [place] [address]  Date: 06/26/2014    Signature: Alma Friendly, MD ____________________ Date:       Patient Name: Anna Marks  MRN: [id number]        Certification Dates: From: 05/07/14  To: 06/11/14    Goals:   End of Certification    Date  (Body Area, Impairment Goal, Functional   Activity, Target Performance)  Time Frame  Status  Date/  Initial    05/07/2014  Increase active cervical rotation to 65  degrees for increased visual field to allow safe changing lanes and backing up vehicle while driving.  06/18/14  Met on R; progress on L 10/20/15PR   05/07/2014  Improve NDI to less than 20%  06/18/14  Progress  06/11/14 PR    05/07/2014  Improve R shoulder flexion, abduction and ER strength to 4/5 or better to be able to lift 2 lbs to overhead shelf.  06/18/14  Progress 4/5 flexion MMT KR 05/23/14   05/07/2014  Improve cervical active extension to 45 deg to be able to look to overhead shelf/item without pain increase.  06/18/14  Progress  06/11/14  pr   05/07/2014  Patient will demonstrate independence in prescribed HEP with proper form, sets and reps for safe discharge to an independent program.   Pro          Plan of Care / Updated Plan of Care IPTC Medicare Provider #: [phone]                Patient Name: Anna Marks  MRN: [id number]  [facility] Medicare #: Medicare Sub. Num: [id number]  DOI: Onset of Problem / Injury: 12/05/13 DOS: N/A  SOC: 05/07/2014     Diagnosis:     ICD-9-CM    1. Cervical pain 723.1          G Codes: Evaluation / Current: V3710 Carry (Objects) CJ (20-40) Goal: G2694 Carry (Objects) CI (1-20)  Primary Functional Deficit: Carrying, Moving and Handling Objects G-codes determined by: NDI, strength, ROM, professional judgement    ASSESSMENT: the patient is a 80 y.o. female presenting with neck and R upper arm pain who requires Physical Therapy for the following:  Impairments: loss of cervical and upper thoracic joint mobility, loss of cervical AROM (especially extension), mild loss of R shoulder AROM, weakness of R shoulder, postural imbalances and soft tissue restrictions  C-rotation AROM:  Flexion 50 deg, extension 37 deg, Side bend R 35 deg, L 30 deg, rotation R 65 L 60  Evaluation AROM: flexion 55, extension 15, side bend 10 deg B, rotation R 50 L 60    R shoulder strength: flexion 4/5, scapular plane 4/5, ER 4-/5, IR 5/5  (initial MMT flexion and scapular plane 3+/5, ER 4-/5)    NDI 22% (26% at eval)    Functional Limitations: Pain in neck and R upper arm- fluctuates in degree.She has trouble carrying things in the R arm- she has to brace the arm to her side to stabilize it.  She can't lift weight overhead now.   She has trouble reaching and lifting overhead with the R UE.  She has been sleeping with 2 pillows at home- a habit started when she had acid reflux.  She wakes with neck stiffness. Neck mobility  has improved overall but is still limited.  Primary ROM limitation is to look up.   Vacuum use is limited by pain.  No numbness, tingling noted.    Plan Of Care: Body Mechanics Education, NMR, Statistician, Instruction in HEP, Therapeutic Exercise and Soft Tissue/Joint Mobilization release scalenes, UTs, LS, cervical /thoracic PVM, Gentle mobs of cervical spine Grade 1-2 to start for ROM increases- especially extension    Care extended with this plan of care to address remaining cervical AROM, and upper quadrant weakness that limits her function- especially with overhead reaching/lifting.  Goals partially met to this point.    Frequency/Duration: 2 times a week for 4 weeks. Anticipated D/C date: 07/12/14  Certification Dates: From: 06/11/14  To: 07/12/14      Goals:   End of Certification   Date (Body Area, Impairment Goal, Functional   Activity, Target Performance) Time Frame Status Date/  Initial   05/07/2014   Increase active cervical rotation to 65  degrees for increased visual field to allow safe changing lanes and backing up vehicle while driving.  07/12/14 Met on R  Progress on L 06/11/14 PR   05/07/2014   Improve NDI to less than 20%  07/12/14 Progress  06/11/14 PR   05/07/2014   Improve R shoulder flexion, abduction and ER strength to 4/5 or better to be able to lift 2 lbs to overhead shelf.  07/12/14 Partially met 06/11/14 KR   05/07/2014   Improve cervical active extension to 45 deg to be able to look to overhead shelf/item without pain increase.  07/12/14 Partially met 10/20 PR   05/07/2014   Patient will demonstrate independence in prescribed HEP with proper form, sets and reps for safe discharge to an independent program.  07/12/14 Partially met KR    Signature: Druscilla Brownie, PT [place]  [id number]  Date: 06/26/2014    Signature: Alma Friendly, MD ___________________________ Date:     Patient Name: Anna Marks  MRN: [id number]       [facility] Ph: [phone]: [phone]

## 2014-06-12 ENCOUNTER — Inpatient Hospital Stay: Payer: Medicare Other

## 2014-06-12 DIAGNOSIS — M542 Cervicalgia: Secondary | ICD-10-CM | POA: Diagnosis not present

## 2014-06-12 NOTE — PT/OT Therapy Note (Signed)
DAILY NOTE  06/12/2014    Time In/Out: 1200- 1245pm Total Time: 45 Visit Number:  11    Certification Status Ends: Jul 09, 2014   G-Codes required on visit # 10    # of Authorized Visits: 14 Visit #: 11    Diagnosis (Treating/Medical):     ICD-10-CM    1. Cervical pain M54.2            Subjective:  Anna Marks says she feels pretty good.  Still have some pain on (R) side of neck  Functional Changes: going exercises as gym, but do not lift arms above head    Objective:   Treatment:  Therapeutic Exercise: to improve: Flexibility/ROM and Strength   Warm up with UBE alternating directions 2 min each way.  Modifications/Patient Education     Cervical rotation in supine 10 each way   Scapular retraction standing 10 reps   B shoulder flexion red tubing 10 reps    red TB ER 10 reps R shoulder (towel at elbow)   redTB B shoulder extension to neutral 10 reps   red TB retraction 10 reps B standing   Red TB horizotnal abduction supine 10 each side   Eccentric flexion with red TB 10 reps supine   Shoulder stretch with cane (AAROM) 10 reps      Therapeutic Activities:  na    Manual Therapy:   STMob to cervical paraspinals, sub occipitals  (B) UT and Levator scapulae;   Manual distraction of neck.             Current Measurements (ROM, Strength, Girth, Outcomes, etc.):     Modalities: Electrical Stimulation with  ice pack: Premod 15 min. Location R UT and R cervical PVM in Supine- LEs supported, towel under R elbow   Therapy Rationale: Decrease Pain, Decrease Spasm and Increase Extensiblility       Assessment (response to treatment):   Localized pain at (R) C5-C7, with (R) UT spasm still remaining which limits (L) SB and rot.  (R) shoulder ROM improved    Progress towards functional goals: learning HEP    Patient requires continued skilled care to: improve cervical AROM and shoulder strength to improve function to lift items in home and on errands    Plan:  Continue with Plan of Care  : increase cervical extension tolerance,  rotation L, RC strength; continue postural awareness    Nestor Ramp  License #[id number]      06/12/2014    Certification Dates: From: 05/07/14  To: Jul 09, 2014    Goals:   End of Certification    Date  (Body Area, Impairment Goal, Functional   Activity, Target Performance)  Time Frame  Status  Date/  Initial    05/07/2014  Increase active cervical rotation to 65  degrees for increased visual field to allow safe changing lanes and backing up vehicle while driving.  07-09-2014  Met on R; progress on L 10/20/15PR   05/07/2014  Improve NDI to less than 20%  July 09, 2014  Progress  06/11/14 PR   05/07/2014  Improve R shoulder flexion, abduction and ER strength to 4/5 or better to be able to lift 2 lbs to overhead shelf.  07/09/2014  Progress 4/5 flexion MMT KR 05/23/14   05/07/2014  Improve cervical active extension to 45 deg to be able to look to overhead shelf/item without pain increase.  07-09-2014  Progress  06/11/14  pr   05/07/2014  Patient will demonstrate independence in prescribed HEP with proper form,  sets and reps for safe discharge to an independent program.   Pro

## 2014-06-17 ENCOUNTER — Inpatient Hospital Stay: Payer: Medicare Other

## 2014-06-17 DIAGNOSIS — M542 Cervicalgia: Secondary | ICD-10-CM | POA: Diagnosis not present

## 2014-06-17 NOTE — PT/OT Therapy Note (Signed)
DAILY NOTE  06/17/2014    Time In/Out: 115-205  Total Time: 50 min Visit Number:  12    Certification Status Ends: 2014/07/15   G-Codes required on visit # 10    # of Authorized Visits: 14 Visit #: 12    Diagnosis (Treating/Medical):     ICD-10-CM    1. Cervical pain M54.2            Subjective:  Anna Marks says she feels pretty good.    Functional Changes: Shoulder is less sore.    Objective:   Treatment:  Therapeutic Exercise: to improve: Flexibility/ROM and Strength   Warm up with UBE alternating directions 2 min each way.  Modifications/Patient Education-- increase reps to x15 today   Cervical rotation in supine 15 each way   Scapular retraction standing 15 reps   B shoulder flexion red tubing 15 reps    red TB ER 15 reps R shoulder (towel at elbow)   redTB B shoulder extension to neutral 15 reps   red TB retraction 15 reps B standing   Red TB horizotnal abduction supine 15 each side   Eccentric flexion with red TB 15 reps supine   Shoulder stretch with cane (AAROM) 10 reps      Therapeutic Activities:  na    Manual Therapy:   STMob to (R) UT/Levator  Manual distraction of neck.   Scapular mobs in (R)            Current Measurements (ROM, Strength, Girth, Outcomes, etc.):     Modalities: Electrical Stimulation with  ice pack: Premod 15 min. Location R UT and R cervical PVM in Supine- LEs supported, towel under R elbow   Therapy Rationale: Decrease Pain, Decrease Spasm and Increase Extensiblility       Assessment (response to treatment):    (R) UT spasm still remaining which limits (L) SB and rot.  (R) shoulder ROM improved and shoulder is pain free.    Progress towards functional goals: learning HEP    Patient requires continued skilled care to: improve cervical AROM and shoulder strength to improve function to lift items in home and on errands    Plan:  Continue with Plan of Care  : increase reps for [name] strength; assess tolerance next visit    Nestor Ramp  License  #[id number]      06/17/2014    Certification Dates: From: 05/07/14  To: July 15, 2014    Goals:   End of Certification    Date  (Body Area, Impairment Goal, Functional   Activity, Target Performance)  Time Frame  Status  Date/  Initial    05/07/2014  Increase active cervical rotation to 65  degrees for increased visual field to allow safe changing lanes and backing up vehicle while driving.  2014/07/15  Met on R; progress on L 10/20/15PR   05/07/2014  Improve NDI to less than 20%  Jul 15, 2014  Progress  06/11/14 PR   05/07/2014  Improve R shoulder flexion, abduction and ER strength to 4/5 or better to be able to lift 2 lbs to overhead shelf.  07-15-2014  Progress 4/5 flexion MMT KR 05/23/14   05/07/2014  Improve cervical active extension to 45 deg to be able to look to overhead shelf/item without pain increase.  07/15/14  Progress  06/11/14  pr   05/07/2014  Patient will demonstrate independence in prescribed HEP with proper form, sets and reps for safe discharge to an independent program.   Pro

## 2014-06-19 ENCOUNTER — Inpatient Hospital Stay: Payer: Medicare Other

## 2014-06-19 DIAGNOSIS — M542 Cervicalgia: Secondary | ICD-10-CM | POA: Diagnosis not present

## 2014-06-19 NOTE — PT/OT Therapy Note (Signed)
DAILY NOTE  06/19/2014    Time In/Out: 130-220  Total Time: 50 min Visit Number:  13    Certification Status Ends: 22-Jun-2014   G-Codes required on visit # 10    # of Authorized Visits:   Visit #: 13    Diagnosis (Treating/Medical):     ICD-10-CM    1. Cervical pain M54.2            Subjective:  Anna Marks says she feels pretty good, and she is surprised since it is a rainy day.    Functional Changes: Felt ok after last session    Objective:   Treatment:  Therapeutic Exercise: to improve: Flexibility/ROM and Strength   Warm up with UBE alternating directions 2 min each way.  Modifications/Patient Education-- increase reps to x15 today   Cervical rotation in supine 15 each way   Scapular retraction standing 15 reps   B shoulder flexion red tubing 15 reps    red TB ER 15 reps R shoulder (towel at elbow)   redTB B shoulder extension to neutral 15 reps   red TB retraction 15 reps B standing   Red TB horizotnal abduction supine 15 each side   Eccentric flexion with red TB 15 reps supine   Shoulder stretch with cane (AAROM) 10 reps      Therapeutic Activities:  na    Manual Therapy:   STMob to (R) UT/Levator  Manual distraction of neck.   Scapular mobs in (R)            Current Measurements (ROM, Strength, Girth, Outcomes, etc.):     Modalities: Electrical Stimulation with  ice pack: Premod 15 min. Location R UT and R cervical PVM in Supine- LEs supported, towel under R elbow   Therapy Rationale: Decrease Pain, Decrease Spasm and Increase Extensiblility       Assessment (response to treatment):   Continues to have (R) UT spasm, but over all mobility improving in neck and shoulder.    Progress towards functional goals: learning HEP    Patient requires continued skilled care to: improve cervical AROM and shoulder strength to improve function to lift items in home and on errands    Plan:  Continue with Plan of Care     Nestor Ramp  License #[id number]      06/19/2014    Certification Dates: From: 05/07/14  To:  06/22/14    Goals:   End of Certification    Date  (Body Area, Impairment Goal, Functional   Activity, Target Performance)  Time Frame  Status  Date/  Initial    05/07/2014  Increase active cervical rotation to 65  degrees for increased visual field to allow safe changing lanes and backing up vehicle while driving.  June 22, 2014  Met on R; progress on L 10/20/15PR   05/07/2014  Improve NDI to less than 20%  2014-06-22  Progress  06/11/14 PR   05/07/2014  Improve R shoulder flexion, abduction and ER strength to 4/5 or better to be able to lift 2 lbs to overhead shelf.  Jun 22, 2014  Progress 4/5 flexion MMT KR 05/23/14   05/07/2014  Improve cervical active extension to 45 deg to be able to look to overhead shelf/item without pain increase.  2014/06/22  Progress  06/11/14  pr   05/07/2014  Patient will demonstrate independence in prescribed HEP with proper form, sets and reps for safe discharge to an independent program.   Pro

## 2014-06-26 ENCOUNTER — Inpatient Hospital Stay: Payer: Medicare Other | Admitting: Rehabilitative and Restorative Service Providers"

## 2014-07-01 ENCOUNTER — Inpatient Hospital Stay: Payer: Medicare Other | Attending: Family Medicine

## 2014-07-01 DIAGNOSIS — M542 Cervicalgia: Secondary | ICD-10-CM | POA: Diagnosis not present

## 2014-07-01 NOTE — PT/OT Therapy Note (Addendum)
DAILY NOTE  07/01/2014    Time In/Out: 1:00-2:00  Total Time: 50 min Visit Number:  14    Certification Status Ends: 07-06-14   G-Codes required on visit # 10    # of Authorized Visits: 8 Visit #: 13    Diagnosis (Treating/Medical):     ICD-10-CM    1. Cervical pain M54.2            Subjective:  Anna Marks reports a pain level of 3-4/10 today, she has noticed improvement with PT.   Functional Changes: Patient is able to reach overhead with greater ease    Objective:   Treatment:  Therapeutic Exercise: to improve: Flexibility/ROM and Strength   VC/TC required to avoid upper trap activation when performing periscapular strengthening exercises      Cervical rotation in supine 15 each way   Scapular retraction standing with red TB 15 reps   R shoulder flexion red tubing 15 reps    red TB ER 15 reps B shoulder    redTB B shoulder extension to neutral 15 reps   Red TB horizotnal abduction supine 15 each side (deferred today)   Shoulder stretch with cane (AAROM) 10 reps   Upper trap stretch in sitting 10 sec hold x 5       Therapeutic Activities:  na    Manual Therapy:   STMob to (R) UT/Levator  Manual distraction of neck.   R shoulder inferior glides and APs with grade II-III           Current Measurements (ROM, Strength, Girth, Outcomes, etc.):     Modalities: Electrical Stimulation with  ice pack: Premod 15 min. Location R UT and R cervical PVM in Supine- LEs supported, towel under R elbow   Therapy Rationale: Decrease Pain, Decrease Spasm and Increase Extensiblility       Assessment (response to treatment):   Patient continues to demonstrate improvement in symptoms, was able to perform upper trap stretch with proper form with cues. She continues to demonstrates cervical ROM gains.     *Charges for this visit were adjusted to void 1 unit of MT because treatment time only allowed for 3 units billed. [name] PT, DPT     Progress towards functional goals:Progressing to become independent with current HEP      Patient requires continued skilled care to: improve cervical AROM and shoulder strength to improve function to lift items in home and on errands    Plan:  Continue with Plan of Care, assess cervical rotation next visit     [name] PT, DPT       07/01/2014    Certification Dates: From: 05/07/14  To: 2014-07-06    Goals:   End of Certification    Date  (Body Area, Impairment Goal, Functional   Activity, Target Performance)  Time Frame  Status  Date/  Initial    05/07/2014  Increase active cervical rotation to 65  degrees for increased visual field to allow safe changing lanes and backing up vehicle while driving.  Jul 06, 2014  Met on R; progress on L 10/20/15PR   05/07/2014  Improve NDI to less than 20%  07/06/2014  Progress  06/11/14 PR   05/07/2014  Improve R shoulder flexion, abduction and ER strength to 4/5 or better to be able to lift 2 lbs to overhead shelf.  07-06-2014  Progress 4/5 flexion MMT KR 05/23/14   05/07/2014  Improve cervical active extension to 45 deg to be able to look to overhead  shelf/item without pain increase.  06/18/14  Progress  06/11/14  pr   05/07/2014  Patient will demonstrate independence in prescribed HEP with proper form, sets and reps for safe discharge to an independent program.                 Progressing

## 2014-07-03 ENCOUNTER — Inpatient Hospital Stay: Payer: Medicare Other

## 2014-07-03 DIAGNOSIS — M542 Cervicalgia: Secondary | ICD-10-CM | POA: Diagnosis not present

## 2014-07-03 DIAGNOSIS — E782 Mixed hyperlipidemia: Secondary | ICD-10-CM | POA: Diagnosis not present

## 2014-07-03 NOTE — PT/OT Therapy Note (Signed)
DAILY NOTE  07/03/2014    Time In/Out: 1:30-2:30  Total Time: 55 min Visit Number:  15    Certification Status Ends: July 17, 2014   G-Codes required on visit # 10    # of Authorized Visits: 8 Visit #: 13    Diagnosis (Treating/Medical):     ICD-10-CM    1. Cervical pain M54.2            Subjective:  Anna Marks reports a pain level of 0-3/10, no pain at rest.   Functional Changes: Patient is able to reach overhead with greater ease    Objective:   Treatment:  Therapeutic Exercise: to improve: Flexibility/ROM and Strength   VC/TC required to avoid upper trap activation when performing periscapular strengthening exercises      Cervical rotation in supine 15 each way   Scapular retraction standing with red TB 15 reps   R shoulder flexion red tubing 15 reps    red TB ER 15 reps B shoulder    redTB B shoulder extension to neutral 15 reps   Red TB horizotnal abduction in standing x 15   Upper trap stretch in sitting 10 sec hold x 5   Trunk flexion stretch with exercise ball 10 sec hold x 5   [place]wan with red TB x 10         Therapeutic Activities:  na    Manual Therapy:   STMob to (R) UT/Levator  Manual distraction of neck.   Cervical PAs with grade II-III to C2-T4           Current Measurements (ROM, Strength, Girth, Outcomes, etc.):   Cervical ROM: before MT  R rot 70 (initial 50)  L rot 61 (initial 60)    Cervical ROM: after MT  L rot 71     Modalities: Electrical Stimulation with ice pack: Premod 15 min. Location R UT and R cervical PVM in Supine- LEs supported, towel under R elbow   Therapy Rationale: Decrease Pain, Decrease Spasm and Increase Extensiblility       Assessment (response to treatment):   Patient demonstrates improved cervical ROM after MT, however continues to report overall stiffness in neck. She reported tenderness when PT performing PAs with grade II-III, this pain did not continue after MT.     Progress towards functional goals: Met cervical rot ROM goal    Patient requires continued skilled care  to: improve cervical AROM and shoulder strength to improve function to lift items in home and on errands    Plan:  Continue with Plan of Care    [name] PT, DPT       07/03/2014    Certification Dates: From: 05/07/14  To: 07/17/2014    Goals:   End of Certification    Date  (Body Area, Impairment Goal, Functional   Activity, Target Performance)  Time Frame  Status  Date/  Initial    05/07/2014  Increase active cervical rotation to 65  degrees for increased visual field to allow safe changing lanes and backing up vehicle while driving.  07/17/14  Met  07/03/14       PG   05/07/2014  Improve NDI to less than 20%  17-Jul-2014  Progress  06/11/14 PR   05/07/2014  Improve R shoulder flexion, abduction and ER strength to 4/5 or better to be able to lift 2 lbs to overhead shelf.  07/17/14  Progress 4/5 flexion MMT KR 05/23/14   05/07/2014  Improve cervical active extension to 45 deg  to be able to look to overhead shelf/item without pain increase.  06/18/14  Progress  06/11/14  pr   05/07/2014  Patient will demonstrate independence in prescribed HEP with proper form, sets and reps for safe discharge to an independent program.                 Progressing

## 2014-07-05 DIAGNOSIS — E785 Hyperlipidemia, unspecified: Secondary | ICD-10-CM | POA: Diagnosis not present

## 2014-07-05 DIAGNOSIS — Z87891 Personal history of nicotine dependence: Secondary | ICD-10-CM | POA: Diagnosis not present

## 2014-07-05 DIAGNOSIS — I10 Essential (primary) hypertension: Secondary | ICD-10-CM | POA: Diagnosis not present

## 2014-07-05 DIAGNOSIS — I6523 Occlusion and stenosis of bilateral carotid arteries: Secondary | ICD-10-CM | POA: Diagnosis not present

## 2014-07-08 ENCOUNTER — Inpatient Hospital Stay: Payer: Medicare Other

## 2014-07-08 DIAGNOSIS — M542 Cervicalgia: Secondary | ICD-10-CM | POA: Diagnosis not present

## 2014-07-08 NOTE — PT/OT Therapy Note (Signed)
DAILY NOTE  07/08/2014    Time In/Out: 1:00-2:00  Total Time: 55 min Visit Number:  16    Certification Status Ends: 2014-07-13   G-Codes required on visit # 10    # of Authorized Visits: 8 Visit #: 13    Diagnosis (Treating/Medical):     ICD-10-CM    1. Cervical pain M54.2            Subjective:  Anna Marks reports a pain level of 2-3/10 currently but overall feels better when compared to before PT, no pain at rest.   Functional Changes: Patient is able to move her neck/head with greater ease and with less pain when compared to before PT.     Objective:   Treatment:  Therapeutic Exercise: to improve: Flexibility/ROM and Strength   VC/TC required to avoid shrugging her shoulders when performing arm raises, flex/scap and abd with weight     Scapular retraction standing with GTB 10 reps   GTB ER 10 reps B shoulder    RTB B shoulder extension to neutral 15 reps   Red TB horizontal abduction in standing x 15   Upper trap stretch in sitting 10 sec hold x 5   Trunk flexion stretch with exercise ball 10 sec hold x 5   Swan with  GTB x 10   Arm lifts to shoulder level with 1#(flex/scap/abd) x 10          Therapeutic Activities:  na    Manual Therapy:   STMob to (R) UT/Levator, Manual distraction of neck, jt mobs PAs with grade II-III to T1-T4           Current Measurements (ROM, Strength, Girth, Outcomes, etc.):     Cervical ROM: after MT  Extension: 32    Modalities: Electrical Stimulation with MH: Premod 10 min. Location R UT and R cervical PVM in Supine- LEs supported, towel under R elbow   Therapy Rationale: Decrease Pain, Decrease Spasm and Increase Extensiblility       Assessment (response to treatment):   Patient is progressing towards meeting her cervical ext ROM, this limitation is most likely due to stiffness and postural deficit. She denied pain with testing.       Progress towards functional goals: progressing towards meeting cervical ext goal     Patient requires continued skilled care to: improve cervical  AROM and shoulder strength to improve function to lift items in home and on errands    Plan:  Continue with Plan of Care    [name] PT, DPT       07/08/2014    Certification Dates: From: 05/07/14  To: 07/13/14    Goals:   End of Certification    Date  (Body Area, Impairment Goal, Functional   Activity, Target Performance)  Time Frame  Status  Date/  Initial    05/07/2014  Increase active cervical rotation to 65  degrees for increased visual field to allow safe changing lanes and backing up vehicle while driving.  07-13-14  Met  07/03/14       PG   05/07/2014  Improve NDI to less than 20%  Jul 13, 2014  Progress  06/11/14 PR   05/07/2014  Improve R shoulder flexion, abduction and ER strength to 4/5 or better to be able to lift 2 lbs to overhead shelf.  07/13/14  Progress 4/5 flexion MMT KR 05/23/14   05/07/2014  Improve cervical active extension to 45 deg to be able to look to overhead shelf/item without pain  increase.  06/18/14  Progressing  07/08/14      PG   05/07/2014  Patient will demonstrate independence in prescribed HEP with proper form, sets and reps for safe discharge to an independent program.                 Progressing

## 2014-07-10 ENCOUNTER — Inpatient Hospital Stay: Payer: Medicare Other

## 2014-07-10 DIAGNOSIS — M542 Cervicalgia: Secondary | ICD-10-CM | POA: Diagnosis not present

## 2014-07-10 NOTE — PT/OT Therapy Note (Signed)
DAILY NOTE  07/10/2014    Time In/Out: 1:30-2:30  Total Time: 55 min Visit Number:  17    Certification Status Ends: 2014-06-29   G-Codes required on visit # 10    # of Authorized Visits: 8 Visit #: 13    Diagnosis (Treating/Medical):     ICD-10-CM    1. Cervical pain M54.2            Subjective:  Anna Marks reports waking up with a stiff neck.   Functional Changes: Patient is able to move her neck/head with greater ease and with less pain when compared to before PT.     Objective:   Treatment:  Therapeutic Exercise: to improve: Flexibility/ROM and Strength   VC/TC required to avoid shrugging her shoulders when performing arm raises, flex/scap and abd with weight     Scapular retraction standing with GTB 10 reps   GTB ER 15 reps B shoulder    RTB B shoulder extension to neutral 15 reps   Red TB horizontal abduction in standing x 15   Upper trap stretch in sitting 10 sec hold x 5   Trunk flexion stretch with exercise ball 10 sec hold x 5   Swan with  GTB x 10      Therapeutic Activities:  na    Manual Therapy:   STMob to (R) UT/Levator, Manual distraction of neck, jt mobs PAs with grade II-III to T1-T4           Current Measurements (ROM, Strength, Girth, Outcomes, etc.):       AROM: Cervical Spine    Initial   07/10/14       Flexion  55   55       Extension  15   32         R  L  R  07/10/14  L  07/10/14    Rotation  50  60  65   60   Side Bending  10  10  20  22     (blank fields were intentionally left blank)      PSFS: 27(initial 57)  NDI:16 (initial 26)        Modalities: Electrical Stimulation with MH: Premod 10 min. Location R UT and R cervical PVM in Supine- LEs supported, towel under R elbow   Therapy Rationale: Decrease Pain, Decrease Spasm and Increase Extensiblility       Assessment (response to treatment):   Patient has attended 17 PT visits at our clinic for neck pain, she has demonstrated gains in both neck ROM and strength. Please see below for objective measures that show such gains. She is being  discharged to HEP today due to these gains, she is able to perform her current daily activities with mild to no pain in neck.     Progress towards functional goals: Met most of her goals     Patient requires continued skilled care to: n/a    Plan:  Discharged from P.T./O.T. Goals Met, Plateau in progress and Self discharge    [name] PT, DPT       07/10/2014    Certification Dates: From: 05/07/14  To: 29-Jun-2014    Goals:   End of Certification    Date  (Body Area, Impairment Goal, Functional   Activity, Target Performance)  Time Frame  Status  Date/  Initial    05/07/2014  Increase active cervical rotation to 65  degrees for increased visual field to allow safe changing lanes and  backing up vehicle while driving.  06/18/14  Met  07/10/14       PG   05/07/2014  Improve NDI to less than 20%  06/18/14  Met 07/10/14        PG   05/07/2014  Improve R shoulder flexion, abduction and ER strength to 4/5 or better to be able to lift 2 lbs to overhead shelf.  06/18/14  Met, functionally 07/10/14       PG   05/07/2014  Improve cervical active extension to 45 deg to be able to look to overhead shelf/item without pain increase.  06/18/14  Progressing  07/10/14      PG   05/07/2014  Patient will demonstrate independence in prescribed HEP with proper form, sets and reps for safe discharge to an independent program.   Progressing  07/10/14       PG   Signature: [name] PT, DPT   Date: 07/10/2014    Signature: Alma Friendly, MD ____________________ Date:

## 2014-07-10 NOTE — PT/OT Plan of Care (Signed)
Plan of Care / Updated Plan of Care IPTC Medicare Provider #: [id number]                Patient Name: Anna Marks  MRN: [id number]  [facility] Medicare #: Medicare Sub. Num: [id number]  DOI: Onset of Problem / Injury: 12/05/13 DOS: N/A  SOC: 05/07/2014     Diagnosis:     ICD-9-CM    1. Cervical pain 723.1          G Codes: Evaluation / Current: W1027 Carry (Objects) CJ (20-40) Goal: O5366 Carry (Objects) CI (1-20)  Primary Functional Deficit: Carrying, Moving and Handling Objects G-codes determined by: NDI, shoulder weakness, ROM and professional judgement    ASSESSMENT: the patient is a 80 y.o. female presenting with neck and R upper arm pain who requires Physical Therapy for the following:  Impairments: loss of cervical and upper thoracic joint mobility, loss of cervical AROM (especially extension), mild loss of R shoulder AROM, weakness of R shoulder, postural imbalances and soft tissue restrictions    Functional Limitations: Pain in neck and R upper arm- fluctuates in degree.She has trouble carrying things in the R arm- she has to brace the arm to her side to stabilize it.  She can't lift weight overhead now.  She can pick up about 2 lbs now.She goes to the gym 2-4 times a week.- light weight/work out class aimed at seniors.  She has been sleeping with 2 pillows at home- a habit started when she had acid reflux.  She wakes with neck stiffness. Limited rotation to the right. LImited ability to look up.   Vacuum use is limited by pain.  No numbness, tingling noted.    Plan Of Care: Body Mechanics Education, NMR, Statistician, Instruction in HEP, Therapeutic Exercise and Soft Tissue/Joint Mobilization release scalenes, UTs, LS, cervical /thoracic PVM, Gentle mobs of cervical spine Grade 1-2 to start for ROM increases- especially extension  Frequency/Duration: 2 times a week for 7 weeks. Anticipated D/C date: 06/18/14    Certification Dates: From: 05/07/14  To: 06/18/14    Goals:   End of Certification   Date  (Body Area, Impairment Goal, Functional   Activity, Target Performance) Time Frame Status Date/  Initial   05/07/2014   Increase active cervical rotation to 65  degrees for increased visual field to allow safe changing lanes and backing up vehicle while driving.  06/18/14 Initial Eval     05/07/2014   Improve NDI to less than 20%  06/18/14 Initial Eval    05/07/2014   Improve R shoulder flexion, abduction and ER strength to 4/5 or better to be able to lift 2 lbs to overhead shelf.  06/18/14 Initial Eval    05/07/2014   Improve cervical active extension to 45 deg to be able to look to overhead shelf/item without pain increase.  06/18/14 Initial Eval    05/07/2014   Patient will demonstrate independence in prescribed HEP with proper form, sets and reps for safe discharge to an independent program.  06/18/14 Initial Eval    Signature: Druscilla Brownie, PT [place] [id number]  Date: 05/07/2014  Signature: Alma Friendly, MD ___________________________ Date:         End of Certification Status:   Service Dates:   From:05/07/14    To: 07/10/2014  Visits from [facility]: 17    Objective Status:    Current Measurements (ROM, Strength, Girth, Outcomes, etc.):       AROM: Cervical Spine    Initial  07/10/14       Flexion  55   55       Extension  15   32         R  L  R  07/10/14  L  07/10/14    Rotation  50  60  65   60   Side Bending  10  10  20  22     (blank fields were intentionally left blank)      PSFS: 27(initial 57)  NDI:16 (initial 26)    Comments:    G Codes:   Goal: Z6109 Carry (Objects) CI (1-20) Discharge: U0454 Carry (Objects) CI (1-20) as determined by Objective measures, clinical judgement, and outcome measures including NDI and PSFS.       Recommendations:   Discharge / Discontinue Physical/Occupational Therapy.  Reason for D/C:  Goals met, Plateau in progress and Patient request    Goals:   End of Certification    Date  (Body Area, Impairment Goal, Functional   Activity, Target Performance)  Time Frame  Status  Date/  Initial     05/07/2014  Increase active cervical rotation to 65  degrees for increased visual field to allow safe changing lanes and backing up vehicle while driving.  06/18/14  Met  07/10/14       PG   05/07/2014  Improve NDI to less than 20%  06/18/14  Met 07/10/14        PG   05/07/2014  Improve R shoulder flexion, abduction and ER strength to 4/5 or better to be able to lift 2 lbs to overhead shelf.  06/18/14  Met, functionally 07/10/14       PG   05/07/2014  Improve cervical active extension to 45 deg to be able to look to overhead shelf/item without pain increase.  06/18/14  Progressing  07/10/14      PG   05/07/2014  Patient will demonstrate independence in prescribed HEP with proper form, sets and reps for safe discharge to an independent program.   Progressing  07/10/14       PG   Signature: [name] PT, DPT   Date: 07/10/2014    Signature: Alma Friendly, MD ____________________ Date:         Signature: [name] PT, DPT   Date: 07/10/2014    Signature: Alma Friendly, MD ____________________ Date:       Patient Name: Anna Marks  MRN: [id number]

## 2014-07-16 ENCOUNTER — Inpatient Hospital Stay: Payer: Medicare Other | Admitting: Rehabilitative and Restorative Service Providers"

## 2014-07-30 DIAGNOSIS — Z23 Encounter for immunization: Secondary | ICD-10-CM | POA: Diagnosis not present

## 2014-12-02 DIAGNOSIS — I10 Essential (primary) hypertension: Secondary | ICD-10-CM | POA: Diagnosis not present

## 2014-12-16 DIAGNOSIS — I10 Essential (primary) hypertension: Secondary | ICD-10-CM | POA: Diagnosis not present

## 2014-12-16 DIAGNOSIS — E785 Hyperlipidemia, unspecified: Secondary | ICD-10-CM | POA: Diagnosis not present

## 2014-12-16 DIAGNOSIS — E559 Vitamin D deficiency, unspecified: Secondary | ICD-10-CM | POA: Diagnosis not present

## 2014-12-16 DIAGNOSIS — J309 Allergic rhinitis, unspecified: Secondary | ICD-10-CM | POA: Diagnosis not present

## 2015-02-10 DIAGNOSIS — L304 Erythema intertrigo: Secondary | ICD-10-CM | POA: Diagnosis not present

## 2015-04-14 DIAGNOSIS — R0981 Nasal congestion: Secondary | ICD-10-CM | POA: Diagnosis not present

## 2015-04-14 DIAGNOSIS — H109 Unspecified conjunctivitis: Secondary | ICD-10-CM | POA: Diagnosis not present

## 2015-05-16 DIAGNOSIS — J310 Chronic rhinitis: Secondary | ICD-10-CM | POA: Diagnosis not present

## 2015-05-16 DIAGNOSIS — H1045 Other chronic allergic conjunctivitis: Secondary | ICD-10-CM | POA: Diagnosis not present

## 2015-05-26 DIAGNOSIS — Z23 Encounter for immunization: Secondary | ICD-10-CM | POA: Diagnosis not present

## 2015-06-17 DIAGNOSIS — Z8739 Personal history of other diseases of the musculoskeletal system and connective tissue: Secondary | ICD-10-CM | POA: Diagnosis not present

## 2015-06-17 DIAGNOSIS — Z853 Personal history of malignant neoplasm of breast: Secondary | ICD-10-CM | POA: Diagnosis not present

## 2015-06-17 DIAGNOSIS — E559 Vitamin D deficiency, unspecified: Secondary | ICD-10-CM | POA: Diagnosis not present

## 2015-06-17 DIAGNOSIS — E785 Hyperlipidemia, unspecified: Secondary | ICD-10-CM | POA: Diagnosis not present

## 2015-06-17 DIAGNOSIS — I10 Essential (primary) hypertension: Secondary | ICD-10-CM | POA: Diagnosis not present

## 2015-07-23 DIAGNOSIS — M8588 Other specified disorders of bone density and structure, other site: Secondary | ICD-10-CM | POA: Diagnosis not present

## 2015-07-23 DIAGNOSIS — M8589 Other specified disorders of bone density and structure, multiple sites: Secondary | ICD-10-CM | POA: Diagnosis not present

## 2015-09-10 DIAGNOSIS — H1045 Other chronic allergic conjunctivitis: Secondary | ICD-10-CM | POA: Diagnosis not present

## 2015-09-10 DIAGNOSIS — J310 Chronic rhinitis: Secondary | ICD-10-CM | POA: Diagnosis not present

## 2015-12-25 DIAGNOSIS — Z853 Personal history of malignant neoplasm of breast: Secondary | ICD-10-CM | POA: Diagnosis not present

## 2015-12-25 DIAGNOSIS — Z0000 Encounter for general adult medical examination without abnormal findings: Secondary | ICD-10-CM | POA: Diagnosis not present

## 2015-12-25 DIAGNOSIS — E785 Hyperlipidemia, unspecified: Secondary | ICD-10-CM | POA: Diagnosis not present

## 2015-12-25 DIAGNOSIS — I10 Essential (primary) hypertension: Secondary | ICD-10-CM | POA: Diagnosis not present

## 2016-01-02 ENCOUNTER — Other Ambulatory Visit (HOSPITAL_COMMUNITY): Payer: Self-pay | Admitting: Gastroenterology

## 2016-01-02 DIAGNOSIS — R109 Unspecified abdominal pain: Secondary | ICD-10-CM | POA: Diagnosis not present

## 2016-01-02 DIAGNOSIS — R110 Nausea: Secondary | ICD-10-CM

## 2016-01-02 DIAGNOSIS — R6881 Early satiety: Secondary | ICD-10-CM | POA: Diagnosis not present

## 2016-01-02 DIAGNOSIS — R194 Change in bowel habit: Secondary | ICD-10-CM | POA: Diagnosis not present

## 2016-01-07 ENCOUNTER — Ambulatory Visit (HOSPITAL_COMMUNITY)
Admission: RE | Admit: 2016-01-07 | Discharge: 2016-01-07 | Disposition: A | Discharge status: Home / Self Care | Payer: Medicare Other | Source: Ambulatory Visit | Attending: Gastroenterology | Admitting: Gastroenterology

## 2016-01-07 DIAGNOSIS — R110 Nausea: Secondary | ICD-10-CM | POA: Diagnosis not present

## 2016-01-07 DIAGNOSIS — R1013 Epigastric pain: Secondary | ICD-10-CM | POA: Diagnosis not present

## 2016-01-07 DIAGNOSIS — R109 Unspecified abdominal pain: Secondary | ICD-10-CM | POA: Insufficient documentation

## 2016-01-26 DIAGNOSIS — K5900 Constipation, unspecified: Secondary | ICD-10-CM | POA: Diagnosis not present

## 2016-01-26 DIAGNOSIS — R198 Other specified symptoms and signs involving the digestive system and abdomen: Secondary | ICD-10-CM | POA: Diagnosis not present

## 2016-01-26 DIAGNOSIS — R194 Change in bowel habit: Secondary | ICD-10-CM | POA: Diagnosis not present

## 2016-01-26 DIAGNOSIS — R110 Nausea: Secondary | ICD-10-CM | POA: Diagnosis not present

## 2016-01-27 ENCOUNTER — Other Ambulatory Visit: Payer: Self-pay | Admitting: Family Medicine

## 2016-01-27 DIAGNOSIS — I10 Essential (primary) hypertension: Secondary | ICD-10-CM | POA: Diagnosis not present

## 2016-01-27 DIAGNOSIS — R001 Bradycardia, unspecified: Secondary | ICD-10-CM | POA: Diagnosis not present

## 2016-01-27 DIAGNOSIS — E785 Hyperlipidemia, unspecified: Secondary | ICD-10-CM | POA: Diagnosis not present

## 2016-01-27 DIAGNOSIS — Z853 Personal history of malignant neoplasm of breast: Secondary | ICD-10-CM | POA: Diagnosis not present

## 2016-01-27 DIAGNOSIS — Z1231 Encounter for screening mammogram for malignant neoplasm of breast: Secondary | ICD-10-CM

## 2016-02-17 ENCOUNTER — Emergency Department (HOSPITAL_COMMUNITY)
Admission: EM | Admit: 2016-02-17 | Discharge: 2016-02-18 | Disposition: A | Discharge status: Home / Self Care | Payer: Medicare Other | Attending: Emergency Medicine | Admitting: Emergency Medicine

## 2016-02-17 ENCOUNTER — Encounter (HOSPITAL_COMMUNITY): Payer: Self-pay | Admitting: Emergency Medicine

## 2016-02-17 DIAGNOSIS — I10 Essential (primary) hypertension: Secondary | ICD-10-CM | POA: Insufficient documentation

## 2016-02-17 DIAGNOSIS — R194 Change in bowel habit: Secondary | ICD-10-CM | POA: Diagnosis not present

## 2016-02-17 DIAGNOSIS — R519 Headache, unspecified: Secondary | ICD-10-CM

## 2016-02-17 DIAGNOSIS — R51 Headache: Secondary | ICD-10-CM | POA: Diagnosis not present

## 2016-02-17 DIAGNOSIS — E785 Hyperlipidemia, unspecified: Secondary | ICD-10-CM | POA: Diagnosis not present

## 2016-02-17 HISTORY — DX: Essential (primary) hypertension: I10

## 2016-02-17 HISTORY — DX: Hyperlipidemia, unspecified: E78.5

## 2016-02-17 LAB — CBC
HEMATOCRIT: 38.9 % (ref 36.0–46.0)
Hemoglobin: 13.6 g/dL (ref 12.0–15.0)
MCH: 30 pg (ref 26.0–34.0)
MCHC: 35 g/dL (ref 30.0–36.0)
MCV: 85.9 fL (ref 78.0–100.0)
PLATELETS: 203 10*3/uL (ref 150–400)
RBC: 4.53 MIL/uL (ref 3.87–5.11)
RDW: 13.1 % (ref 11.5–15.5)
WBC: 4.9 10*3/uL (ref 4.0–10.5)

## 2016-02-17 LAB — BASIC METABOLIC PANEL
Anion gap: 8 (ref 5–15)
BUN: 10 mg/dL (ref 6–20)
CHLORIDE: 100 mmol/L — AB (ref 101–111)
CO2: 25 mmol/L (ref 22–32)
CREATININE: 0.7 mg/dL (ref 0.44–1.00)
Calcium: 9.7 mg/dL (ref 8.9–10.3)
GFR calc non Af Amer: >60 mL/min (ref >60)
Glucose, Bld: 113 mg/dL — ABNORMAL HIGH (ref 65–99)
POTASSIUM: 3.8 mmol/L (ref 3.5–5.1)
Sodium: 133 mmol/L — ABNORMAL LOW (ref 135–145)

## 2016-02-17 LAB — I-STAT TROPONIN, ED: Troponin i, poc: 0 ng/mL (ref 0.00–0.08)

## 2016-02-17 NOTE — ED Notes (Signed)
Pt states that she started having a 'woozy' feeling with a  HA and HTN tonight. Took her BP and states that it was in [id number] systolic. Also states she has had heart palpitations and abdominal pain. She said her PCP told her to cut her metoprolol from 50mg  to 25mg  approx. 1 month ago because of a low HR. Alert and oriented.

## 2016-02-18 ENCOUNTER — Emergency Department (HOSPITAL_COMMUNITY): Payer: Medicare Other

## 2016-02-18 DIAGNOSIS — R51 Headache: Secondary | ICD-10-CM | POA: Diagnosis not present

## 2016-02-18 LAB — URINE MICROSCOPIC-ADD ON
Bacteria, UA: NONE SEEN
RBC / HPF: NONE SEEN RBC/hpf (ref 0–5)
WBC, UA: NONE SEEN WBC/hpf (ref 0–5)

## 2016-02-18 LAB — URINALYSIS, ROUTINE W REFLEX MICROSCOPIC
BILIRUBIN URINE: NEGATIVE
GLUCOSE, UA: NEGATIVE mg/dL
HGB URINE DIPSTICK: NEGATIVE
Ketones, ur: NEGATIVE mg/dL
Leukocytes, UA: NEGATIVE
Nitrite: NEGATIVE
PROTEIN: 30 mg/dL — AB
Specific Gravity, Urine: 1.009 (ref 1.005–1.030)
pH: 7.5 (ref 5.0–8.0)

## 2016-02-18 MED ORDER — ACETAMINOPHEN 500 MG PO TABS
1000.0000 mg | ORAL_TABLET | Freq: Once | ORAL | Status: AC
  Administered 2016-02-18: 1000 mg via ORAL
  Filled 2016-02-18: qty 2

## 2016-02-18 NOTE — Discharge Instructions (Signed)
General Headache Without Cause A headache is pain or discomfort felt around the head or neck area. The specific cause of a headache may not be found. There are many causes and types of headaches. A few common ones are:  Tension headaches.  Migraine headaches.  Cluster headaches.  Chronic daily headaches. HOME CARE INSTRUCTIONS  Watch your condition for any changes. Take these steps to help with your condition: Managing Pain  Take over-the-counter and prescription medicines only as told by your health care provider.  Lie down in a dark, quiet room when you have a headache.  If directed, apply ice to the head and neck area:  Put ice in a plastic bag.  Place a towel between your skin and the bag.  Leave the ice on for 20 minutes, 2-3 times per day.  Use a heating pad or hot shower to apply heat to the head and neck area as told by your health care provider.  Keep lights dim if bright lights bother you or make your headaches worse. Eating and Drinking  Eat meals on a regular schedule.  Limit alcohol use.  Decrease the amount of caffeine you drink, or stop drinking caffeine. General Instructions  Keep all follow-up visits as told by your health care provider. This is important.  Keep a headache journal to help find out what may trigger your headaches. For example, write down:  What you eat and drink.  How much sleep you get.  Any change to your diet or medicines.  Try massage or other relaxation techniques.  Limit stress.  Sit up straight, and do not tense your muscles.  Do not use tobacco products, including cigarettes, chewing tobacco, or e-cigarettes. If you need help quitting, ask your health care provider.  Exercise regularly as told by your health care provider.  Sleep on a regular schedule. Get 7-9 hours of sleep, or the amount recommended by your health care provider. SEEK MEDICAL CARE IF:   Your symptoms are not helped by medicine.  You have a  headache that is different from the usual headache.  You have nausea or you vomit.  You have a fever. SEEK IMMEDIATE MEDICAL CARE IF:   Your headache becomes severe.  You have repeated vomiting.  You have a stiff neck.  You have a loss of vision.  You have problems with speech.  You have pain in the eye or ear.  You have muscular weakness or loss of muscle control.  You lose your balance or have trouble walking.  You feel faint or pass out.  You have confusion.   This information is not intended to replace advice given to you by your health care provider. Make sure you discuss any questions you have with your health care provider.   Document Released: 08/09/2005 Document Revised: 04/30/2015 Document Reviewed: 12/02/2014 Elsevier Interactive Patient Education 2016 [employer].  Hypertension Hypertension, commonly called high blood pressure, is when the force of blood pumping through your arteries is too strong. Your arteries are the blood vessels that carry blood from your heart throughout your body. A blood pressure reading consists of a higher number over a lower number, such as 110/72. The higher number (systolic) is the pressure inside your arteries when your heart pumps. The lower number (diastolic) is the pressure inside your arteries when your heart relaxes. Ideally you want your blood pressure below 120/80. Hypertension forces your heart to work harder to pump blood. Your arteries may become narrow or stiff. Having untreated or  uncontrolled hypertension can cause heart attack, stroke, kidney disease, and other problems. °RISK FACTORS °Some risk factors for high blood pressure are controllable. Others are not.  °Risk factors you cannot control include:  °· Race. You may be at higher risk if you are African American. °· Age. Risk increases with age. °· Gender. Men are at higher risk than women before age 45 years. After age 65, women are at higher risk than men. °Risk factors  you can control include: °· Not getting enough exercise or physical activity. °· Being overweight. °· Getting too much fat, sugar, calories, or salt in your diet. °· Drinking too much alcohol. °SIGNS AND SYMPTOMS °Hypertension does not usually cause signs or symptoms. Extremely high blood pressure (hypertensive crisis) may cause headache, anxiety, shortness of breath, and nosebleed. °DIAGNOSIS °To check if you have hypertension, your health care provider will measure your blood pressure while you are seated, with your arm held at the level of your heart. It should be measured at least twice using the same arm. Certain conditions can cause a difference in blood pressure between your right and left arms. A blood pressure reading that is higher than normal on one occasion does not mean that you need treatment. If it is not clear whether you have high blood pressure, you may be asked to return on a different day to have your blood pressure checked again. Or, you may be asked to monitor your blood pressure at home for 1 or more weeks. °TREATMENT °Treating high blood pressure includes making lifestyle changes and possibly taking medicine. Living a healthy lifestyle can help lower high blood pressure. You may need to change some of your habits. °Lifestyle changes may include: °· Following the DASH diet. This diet is high in fruits, vegetables, and whole grains. It is low in salt, red meat, and added sugars. °· Keep your sodium intake below 2,300 mg per day. °· Getting at least 30-45 minutes of aerobic exercise at least 4 times per week. °· Losing weight if necessary. °· Not smoking. °· Limiting alcoholic beverages. °· Learning ways to reduce stress. °Your health care provider may prescribe medicine if lifestyle changes are not enough to get your blood pressure under control, and if one of the following is true: °· You are 18-59 years of age and your systolic blood pressure is above 140. °· You are 60 years of age or older,  and your systolic blood pressure is above 150. °· Your diastolic blood pressure is above 90. °· You have diabetes, and your systolic blood pressure is over 140 or your diastolic blood pressure is over 90. °· You have kidney disease and your blood pressure is above 140/90. °· You have heart disease and your blood pressure is above 140/90. °Your personal target blood pressure may vary depending on your medical conditions, your age, and other factors. °HOME CARE INSTRUCTIONS °· Have your blood pressure rechecked as directed by your health care provider.   °· Take medicines only as directed by your health care provider. Follow the directions carefully. Blood pressure medicines must be taken as prescribed. The medicine does not work as well when you skip doses. Skipping doses also puts you at risk for problems. °· Do not smoke.   °· Monitor your blood pressure at home as directed by your health care provider.  °SEEK MEDICAL CARE IF:  °· You think you are having a reaction to medicines taken. °· You have recurrent headaches or feel dizzy. °· You have swelling in your   ankles.  You have trouble with your vision. SEEK IMMEDIATE MEDICAL CARE IF:  You develop a severe headache or confusion.  You have unusual weakness, numbness, or feel faint.  You have severe chest or abdominal pain.  You vomit repeatedly.  You have trouble breathing. MAKE SURE YOU:   Understand these instructions.  Will watch your condition.  Will get help right away if you are not doing well or get worse.   This information is not intended to replace advice given to you by your health care provider. Make sure you discuss any questions you have with your health care provider.   Document Released: 08/09/2005 Document Revised: 12/24/2014 Document Reviewed: 06/01/2013 Elsevier Interactive Patient Education 2016 [place] Your High Blood Pressure Blood pressure is a measurement of how forceful your blood is pressing  against the walls of the arteries. Arteries are muscular tubes within the circulatory system. Blood pressure does not stay the same. Blood pressure rises when you are active, excited, or nervous; and it lowers during sleep and relaxation. If the numbers measuring your blood pressure stay above normal most of the time, you are at risk for health problems. High blood pressure (hypertension) is a long-term (chronic) condition in which blood pressure is elevated. A blood pressure reading is recorded as two numbers, such as 120 over 80 (or 120/80). The first, higher number is called the systolic pressure. It is a measure of the pressure in your arteries as the heart beats. The second, lower number is called the diastolic pressure. It is a measure of the pressure in your arteries as the heart relaxes between beats.  Keeping your blood pressure in a normal range is important to your overall health and prevention of health problems, such as heart disease and stroke. When your blood pressure is uncontrolled, your heart has to work harder than normal. High blood pressure is a very common condition in adults because blood pressure tends to rise with age. Men and women are equally likely to have hypertension but at different times in life. Before age 68, men are more likely to have hypertension. After 80 years of age, women are more likely to have it. Hypertension is especially common in African Americans. This condition often has no signs or symptoms. The cause of the condition is usually not known. Your caregiver can help you come up with a plan to keep your blood pressure in a normal, healthy range. BLOOD PRESSURE STAGES Blood pressure is classified into four stages: normal, prehypertension, stage 1, and stage 2. Your blood pressure reading will be used to determine what type of treatment, if any, is necessary. Appropriate treatment options are tied to these four stages:  Normal  Systolic pressure (mm Hg): below  120.  Diastolic pressure (mm Hg): below 80. Prehypertension  Systolic pressure (mm Hg): 120 to 139.  Diastolic pressure (mm Hg): 80 to 89. Stage1  Systolic pressure (mm Hg): 140 to 159.  Diastolic pressure (mm Hg): 90 to 99. Stage2  Systolic pressure (mm Hg): 160 or above.  Diastolic pressure (mm Hg): 100 or above. RISKS RELATED TO HIGH BLOOD PRESSURE Managing your blood pressure is an important responsibility. Uncontrolled high blood pressure can lead to:  A heart attack.  A stroke.  A weakened blood vessel (aneurysm).  Heart failure.  Kidney damage.  Eye damage.  Metabolic syndrome.  Memory and concentration problems. HOW TO MANAGE YOUR BLOOD PRESSURE Blood pressure can be managed effectively with lifestyle changes and medicines (if  needed). Your caregiver will help you come up with a plan to bring your blood pressure within a normal range. Your plan should include the following: Education  Read all information provided by your caregivers about how to control blood pressure.  Educate yourself on the latest guidelines and treatment recommendations. New research is always being done to further define the risks and treatments for high blood pressure. Lifestylechanges  Control your weight.  Avoid smoking.  Stay physically active.  Reduce the amount of salt in your diet.  Reduce stress.  Control any chronic conditions, such as high cholesterol or diabetes.  Reduce your alcohol intake. Medicines  Several medicines (antihypertensive medicines) are available, if needed, to bring blood pressure within a normal range. Communication  Review all the medicines you take with your caregiver because there may be side effects or interactions.  Talk with your caregiver about your diet, exercise habits, and other lifestyle factors that may be contributing to high blood pressure.  See your caregiver regularly. Your caregiver can help you create and adjust your plan  for managing high blood pressure. RECOMMENDATIONS FOR TREATMENT AND FOLLOW-UP  The following recommendations are based on current guidelines for managing high blood pressure in nonpregnant adults. Use these recommendations to identify the proper follow-up period or treatment option based on your blood pressure reading. You can discuss these options with your caregiver.  Systolic pressure of 123456 to [id number] or diastolic pressure of 80 to 89: Follow up with your caregiver as directed.  Systolic pressure of [id number] to 0000000 or diastolic pressure of 90 to 100: Follow up with your caregiver within 2 months.  Systolic pressure above 0000000 or diastolic pressure above [id number]: Follow up with your caregiver within 1 month.  Systolic pressure above [id number] or diastolic pressure above [id number]: Consider antihypertensive therapy; follow up with your caregiver within 1 week.  Systolic pressure above A999333 or diastolic pressure above 123456: Begin antihypertensive therapy; follow up with your caregiver within 1 week.   This information is not intended to replace advice given to you by your health care provider. Make sure you discuss any questions you have with your health care provider.   Document Released: 05/03/2012 Document Reviewed: 05/03/2012 Elsevier Interactive Patient Education [employer].  How to Take Your Blood Pressure HOW DO I GET A BLOOD PRESSURE MACHINE?  You can buy an electronic home blood pressure machine at your local pharmacy. Insurance will sometimes cover the cost if you have a prescription.  Ask your doctor what type of machine is best for you. There are different machines for your arm and your wrist.  If you decide to buy a machine to check your blood pressure on your arm, first check the size of your arm so you can buy the right size cuff. To check the size of your arm:   Use a measuring tape that shows both inches and centimeters.   Wrap the measuring tape around the upper-middle part of your  arm. You may need someone to help you measure.   Write down your arm measurement in both inches and centimeters.   To measure your blood pressure correctly, it is important to have the right size cuff.   If your arm is up to 13 inches (up to 34 centimeters), get an adult cuff size.  If your arm is 13 to 17 inches (35 to 44 centimeters), get a large adult cuff size.    If your arm is 17 to 20 inches (45 to 52  centimeters), get an adult thigh cuff.  WHAT DO THE NUMBERS MEAN?   There are two numbers that make up your blood pressure. For example: 120/80.  The first number (120 in our example) is called the "systolic pressure." It is a measure of the pressure in your blood vessels when your heart is pumping blood.  The second number (80 in our example) is called the "diastolic pressure." It is a measure of the pressure in your blood vessels when your heart is resting between beats.  Your doctor will tell you what your blood pressure should be. WHAT SHOULD I DO BEFORE I CHECK MY BLOOD PRESSURE?   Try to rest or relax for at least 30 minutes before you check your blood pressure.  Do not smoke.  Do not have any drinks with caffeine, such as:  Soda.  Coffee.  Tea.  Check your blood pressure in a quiet room.  Sit down and stretch out your arm on a table. Keep your arm at about the level of your heart. Let your arm relax.  Make sure that your legs are not crossed. HOW DO I CHECK MY BLOOD PRESSURE?  Follow the directions that came with your machine.  Make sure you remove any tight-fitting clothing from your arm or wrist. Wrap the cuff around your upper arm or wrist. You should be able to fit a finger between the cuff and your arm. If you cannot fit a finger between the cuff and your arm, it is too tight and should be removed and rewrapped.  Some units require you to manually pump up the arm cuff.  Automatic units inflate the cuff when you press a button.  Cuff deflation is  automatic in both models.  After the cuff is inflated, the unit measures your blood pressure and pulse. The readings are shown on a monitor. Hold still and breathe normally while the cuff is inflated.  Getting a reading takes less than a minute.  Some models store readings in a memory. Some provide a printout of readings. If your machine does not store your readings, keep a written record.  Take readings with you to your next visit with your doctor.   This information is not intended to replace advice given to you by your health care provider. Make sure you discuss any questions you have with your health care provider.   Document Released: 07/22/2008 Document Revised: 08/30/2014 Document Reviewed: 10/04/2013 Elsevier Interactive Patient Education [employer].

## 2016-02-18 NOTE — ED Notes (Signed)
Pt returned from CT °

## 2016-02-18 NOTE — ED Provider Notes (Signed)
TIME SEEN: 12:34 pm  CHIEF COMPLAINT: Headache & HTN  HPI:  HPI Comments: Brenda Holmes is a 82 y.o. female with a PMHx of HTN who presents to the Emergency Department complaining of a sudden onset generalized throbbing headache that occurred 3 hours ago. Pt states she was watching TV tonight when her symptoms started. Pt took her blood pressure at home at reports it was approximately A999333 systolic. Pt also complains of associated nausea and shaking during the onset. No fever, recent head injury, anticoagulation use, numbness, tingling or focal weakness. States she has had similar headaches in the past. States normally these headaches resolve with Tylenol. Did not try any medications at home. She states she got concerned because her blood pressure was elevated. Reports she is normally in the [id number] to 0000000 systolic. She is on metoprolol 25 mg once a day. Was previously on metoprolol 50 mg once a day but had this reduced a month ago because of low heart rate. Not on any other blood pressure medication. No photophobia, vomiting.  ROS: See HPI Constitutional: no fever  Eyes: no drainage  ENT: no runny nose   Cardiovascular:  no chest pain  Resp: no SOB  GI: no vomiting GU: no dysuria Integumentary: no rash  Allergy: no hives  Musculoskeletal: no leg swelling  Neurological: no slurred speech ROS otherwise negative  PAST MEDICAL HISTORY/PAST SURGICAL HISTORY:  Past Medical History  Diagnosis Date  . Hypertension   . Hyperlipidemia     MEDICATIONS:  Prior to Admission medications   Medication Sig Start Date End Date Taking? Authorizing Provider  aspirin EC 81 MG tablet Take 81 mg by mouth daily.   Yes Historical Provider, MD  azelastine (ASTELIN) 0.1 % nasal spray Place 1 spray into both nostrils daily. Use in each nostril as directed   Yes Historical Provider, MD  calcium carbonate (OSCAL) 1500 (600 Ca) MG TABS tablet Take 1,500 mg by mouth 2 (two) times daily with a meal.   Yes  Historical Provider, MD  cholecalciferol (VITAMIN D) 1000 units tablet Take 1,000 Units by mouth daily.   Yes Historical Provider, MD  levocetirizine (XYZAL) 5 MG tablet Take 5 mg by mouth every evening.   Yes Historical Provider, MD  metoprolol succinate (TOPROL-XL) 25 MG 24 hr tablet Take 25 mg by mouth daily.   Yes Historical Provider, MD  Multiple Vitamins-Minerals (MULTIVITAMIN ADULT PO) Take 0.5 tablets by mouth 2 (two) times daily.   Yes Historical Provider, MD  rosuvastatin (CRESTOR) 5 MG tablet Take 5 mg by mouth every other day.   Yes Historical Provider, MD    ALLERGIES:  Allergies  Allergen Reactions  . Gluten Meal Nausea And Vomiting  . Lactose Intolerance (Gi) Nausea And Vomiting  . Neosporin [Neomycin-Bacitracin Zn-Polymyx] Swelling    SOCIAL HISTORY:  Social History  Substance Use Topics  . Smoking status: Never Smoker   . Smokeless tobacco: Not on file  . Alcohol Use: Yes    FAMILY HISTORY: No family history on file.  EXAM: BP 199/80 mmHg  Pulse 74  Temp(Src) 98.4 F (36.9 C) (Oral)  Resp 18  SpO2 100% CONSTITUTIONAL: Alert and oriented and responds appropriately to questions. Well-appearing; well-nourished, Elderly, in no distress HEAD: Normocephalic EYES: Conjunctivae clear, PERRL, extraocular movements intact, no photophobia ENT: normal nose; no rhinorrhea; moist mucous membranes NECK: Supple, no meningismus, no LAD; no nuchal rigidity CARD: RRR; S1 and S2 appreciated; no murmurs, no clicks, no rubs, no gallops RESP: Normal chest excursion  without splinting or tachypnea; breath sounds clear and equal bilaterally; no wheezes, no rhonchi, no rales, no hypoxia or respiratory distress, speaking full sentences ABD/GI: Normal bowel sounds; non-distended; soft, non-tender, no rebound, no guarding, no peritoneal signs BACK:  The back appears normal and is non-tender to palpation, there is no CVA tenderness EXT: Normal ROM in all joints; non-tender to palpation;  no edema; normal capillary refill; no cyanosis, no calf tenderness or swelling    SKIN: Normal color for age and race; warm; no rash NEURO: Moves all extremities equally, sensation to light touch intact diffusely, cranial nerves II through XII intact, No dysmetria with finger to nose test bilaterally, normal gait PSYCH: The patient's mood and manner are appropriate. Grooming and personal hygiene are appropriate.  MEDICAL DECISION MAKING: Patient here with a headache. Has had similar headaches before. Was hypertensive at home and this is slowly improving. Neurologically intact. No sign of infection. Labs ordered in triage of been unremarkable. CT of her head ordered to rule out intracranial hemorrhage. She is within 6 hours of onset of symptoms. We'll give Tylenol for her headache and reassess.  ED PROGRESS: Head CT shows no acute abnormality and specifically no intracranial hemorrhage. Urine shows proteinuria which I have advised her father PCP for but no sign of infection or hematuria. She reports feeling much better and her blood pressure has come down as her headache has improved. I do not feel we need to adjust her blood pressure medication at this time. Discussed with patient if she develops another similar headache she may take Tylenol at home for pain. Have recommended she follow-up with her primary care physician for further management and monitoring of her blood pressure. Discussed return precautions with patient and her husband at bedside. They verbalize understanding and are comfortable with this plan.    EKG Interpretation  Date/Time:  Tuesday February 17 2016 22:40:14 EDT Ventricular Rate:  70 PR Interval:    QRS Duration: 86 QT Interval:  367 QTC Calculation: 396 R Axis:   43 Text Interpretation:  Sinus rhythm Consider left atrial enlargement Minimal ST depression, diffuse leads Baseline wander in lead(s) V2 V4 No old tracing to compare Confirmed by Alexa Golebiewski,  DO, [place]ia Vincelette ([id number]) on  02/18/2016 12:09:22 AM Also confirmed by Gala Padovano,  DO, Gwynneth Fabio ([id number]), editor [place]  [facility], BEVERLY ([id number])  on 02/18/2016 7:01:07 AM        I personally performed the services described in this documentation, which was scribed in my presence. The recorded information has been reviewed and is accurate.      [place], DO 02/18/16 [phone]

## 2016-02-18 NOTE — ED Notes (Signed)
Pt ambulatory to BR with steady gait.

## 2016-03-01 DIAGNOSIS — J329 Chronic sinusitis, unspecified: Secondary | ICD-10-CM | POA: Diagnosis not present

## 2016-03-01 DIAGNOSIS — I10 Essential (primary) hypertension: Secondary | ICD-10-CM | POA: Diagnosis not present

## 2016-03-23 ENCOUNTER — Ambulatory Visit: Payer: Medicare Other

## 2016-04-06 DIAGNOSIS — I10 Essential (primary) hypertension: Secondary | ICD-10-CM | POA: Diagnosis not present

## 2016-04-10 DIAGNOSIS — R300 Dysuria: Secondary | ICD-10-CM | POA: Diagnosis not present

## 2016-04-30 DIAGNOSIS — Z23 Encounter for immunization: Secondary | ICD-10-CM | POA: Diagnosis not present

## 2016-04-30 DIAGNOSIS — J309 Allergic rhinitis, unspecified: Secondary | ICD-10-CM | POA: Diagnosis not present

## 2016-04-30 DIAGNOSIS — E785 Hyperlipidemia, unspecified: Secondary | ICD-10-CM | POA: Diagnosis not present

## 2016-04-30 DIAGNOSIS — Z853 Personal history of malignant neoplasm of breast: Secondary | ICD-10-CM | POA: Diagnosis not present

## 2016-04-30 DIAGNOSIS — I10 Essential (primary) hypertension: Secondary | ICD-10-CM | POA: Diagnosis not present

## 2016-05-14 ENCOUNTER — Ambulatory Visit
Admission: RE | Admit: 2016-05-14 | Discharge: 2016-05-14 | Disposition: A | Discharge status: Home / Self Care | Payer: Medicare Other | Source: Ambulatory Visit | Attending: Family Medicine | Admitting: Family Medicine

## 2016-05-14 ENCOUNTER — Other Ambulatory Visit: Payer: Self-pay | Admitting: Family Medicine

## 2016-05-14 DIAGNOSIS — Z1231 Encounter for screening mammogram for malignant neoplasm of breast: Secondary | ICD-10-CM

## 2016-07-08 DIAGNOSIS — H5201 Hypermetropia, right eye: Secondary | ICD-10-CM | POA: Diagnosis not present

## 2016-07-08 DIAGNOSIS — Z961 Presence of intraocular lens: Secondary | ICD-10-CM | POA: Diagnosis not present

## 2016-10-12 DIAGNOSIS — K5900 Constipation, unspecified: Secondary | ICD-10-CM | POA: Diagnosis not present

## 2016-10-29 DIAGNOSIS — H1045 Other chronic allergic conjunctivitis: Secondary | ICD-10-CM | POA: Diagnosis not present

## 2016-10-29 DIAGNOSIS — J310 Chronic rhinitis: Secondary | ICD-10-CM | POA: Diagnosis not present

## 2016-12-28 DIAGNOSIS — E785 Hyperlipidemia, unspecified: Secondary | ICD-10-CM | POA: Diagnosis not present

## 2016-12-28 DIAGNOSIS — I10 Essential (primary) hypertension: Secondary | ICD-10-CM | POA: Diagnosis not present

## 2016-12-28 DIAGNOSIS — Z0000 Encounter for general adult medical examination without abnormal findings: Secondary | ICD-10-CM | POA: Diagnosis not present

## 2017-02-04 DIAGNOSIS — B370 Candidal stomatitis: Secondary | ICD-10-CM | POA: Diagnosis not present

## 2017-04-13 DIAGNOSIS — K1370 Unspecified lesions of oral mucosa: Secondary | ICD-10-CM | POA: Diagnosis not present

## 2017-04-13 DIAGNOSIS — I10 Essential (primary) hypertension: Secondary | ICD-10-CM | POA: Diagnosis not present

## 2017-04-22 ENCOUNTER — Other Ambulatory Visit: Payer: Self-pay | Admitting: Family Medicine

## 2017-04-22 DIAGNOSIS — Z1231 Encounter for screening mammogram for malignant neoplasm of breast: Secondary | ICD-10-CM

## 2017-04-27 DIAGNOSIS — Z23 Encounter for immunization: Secondary | ICD-10-CM | POA: Diagnosis not present

## 2017-05-04 DIAGNOSIS — F17290 Nicotine dependence, other tobacco product, uncomplicated: Secondary | ICD-10-CM | POA: Diagnosis not present

## 2017-05-04 DIAGNOSIS — D1039 Benign neoplasm of other parts of mouth: Secondary | ICD-10-CM | POA: Diagnosis not present

## 2017-05-04 DIAGNOSIS — F17210 Nicotine dependence, cigarettes, uncomplicated: Secondary | ICD-10-CM | POA: Diagnosis not present

## 2017-05-04 DIAGNOSIS — K1379 Other lesions of oral mucosa: Secondary | ICD-10-CM | POA: Diagnosis not present

## 2017-05-04 DIAGNOSIS — Z7289 Other problems related to lifestyle: Secondary | ICD-10-CM | POA: Diagnosis not present

## 2017-05-04 DIAGNOSIS — B3789 Other sites of candidiasis: Secondary | ICD-10-CM | POA: Diagnosis not present

## 2017-05-17 ENCOUNTER — Ambulatory Visit
Admission: RE | Admit: 2017-05-17 | Discharge: 2017-05-17 | Disposition: A | Discharge status: Home / Self Care | Payer: Medicare Other | Source: Ambulatory Visit | Attending: Family Medicine | Admitting: Family Medicine

## 2017-05-17 DIAGNOSIS — Z1231 Encounter for screening mammogram for malignant neoplasm of breast: Secondary | ICD-10-CM | POA: Diagnosis not present

## 2017-05-17 HISTORY — DX: Malignant (primary) neoplasm, unspecified: C80.1

## 2017-05-17 HISTORY — DX: Personal history of antineoplastic chemotherapy: Z92.21

## 2017-06-07 DIAGNOSIS — K1370 Unspecified lesions of oral mucosa: Secondary | ICD-10-CM | POA: Diagnosis not present

## 2017-07-04 DIAGNOSIS — J309 Allergic rhinitis, unspecified: Secondary | ICD-10-CM | POA: Diagnosis not present

## 2017-07-04 DIAGNOSIS — Z853 Personal history of malignant neoplasm of breast: Secondary | ICD-10-CM | POA: Diagnosis not present

## 2017-07-04 DIAGNOSIS — I10 Essential (primary) hypertension: Secondary | ICD-10-CM | POA: Diagnosis not present

## 2017-07-04 DIAGNOSIS — E785 Hyperlipidemia, unspecified: Secondary | ICD-10-CM | POA: Diagnosis not present

## 2017-07-12 DIAGNOSIS — H524 Presbyopia: Secondary | ICD-10-CM | POA: Diagnosis not present

## 2017-07-12 DIAGNOSIS — H26492 Other secondary cataract, left eye: Secondary | ICD-10-CM | POA: Diagnosis not present

## 2017-08-05 DIAGNOSIS — L438 Other lichen planus: Secondary | ICD-10-CM | POA: Diagnosis not present

## 2017-08-05 DIAGNOSIS — R350 Frequency of micturition: Secondary | ICD-10-CM | POA: Diagnosis not present

## 2017-08-05 DIAGNOSIS — N390 Urinary tract infection, site not specified: Secondary | ICD-10-CM | POA: Diagnosis not present

## 2017-08-05 DIAGNOSIS — L439 Lichen planus, unspecified: Secondary | ICD-10-CM | POA: Diagnosis not present

## 2017-09-03 IMAGING — RF DG UGI W/ KUB
14 of 24 series · 14 of 24 positions shown · non-contrast
Comparison: None.

CLINICAL DATA: Epigastric pain and swelling.

EXAM:
UPPER GI SERIES WITH KUB
TECHNIQUE: After obtaining a scout radiograph a routine upper GI series was
performed using thin and high density barium.
FLUOROSCOPY TIME:  Radiation Exposure Index (as provided by the
fluoroscopic device): 10.4 mGy

[Series 1: t abdomen supine · 0.15mm/px · 1 of 1 slices shown]
[im 1/1]
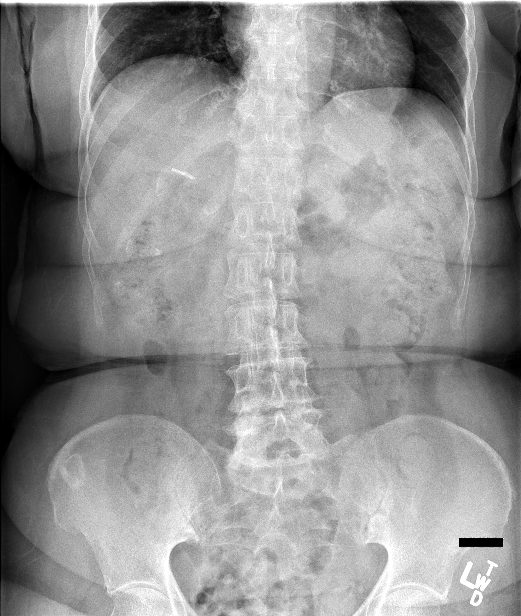

[Series 3: cp_standard · 0.26mm/px · 1 of 1 slices shown (1 of 13)]
[im 1/1]
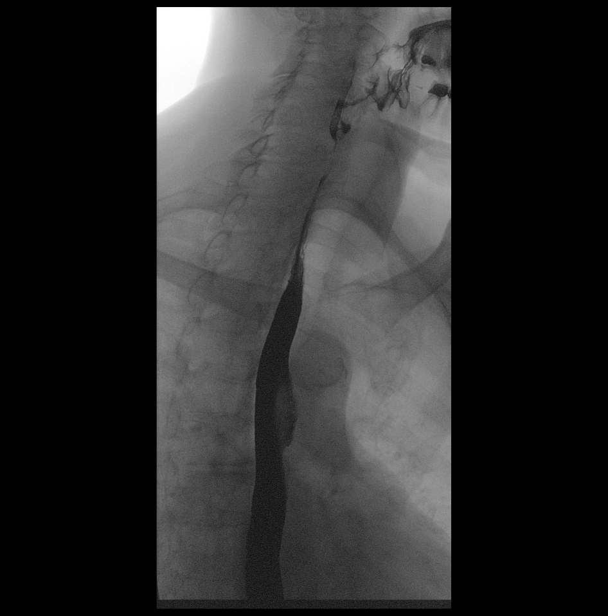

[Series 5: cp_standard · 0.26mm/px · 1 of 1 slices shown (2 of 13)]
[im 1/1]
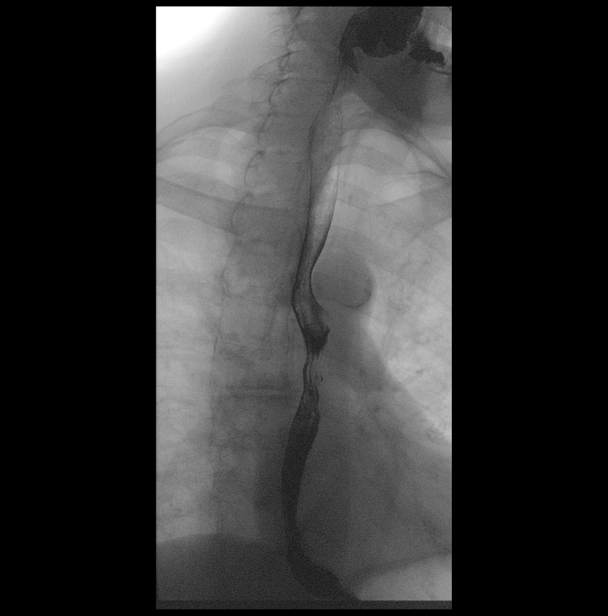

[Series 7: cp_standard · 0.26mm/px · 1 of 1 slices shown (3 of 13)]
[im 1/1]
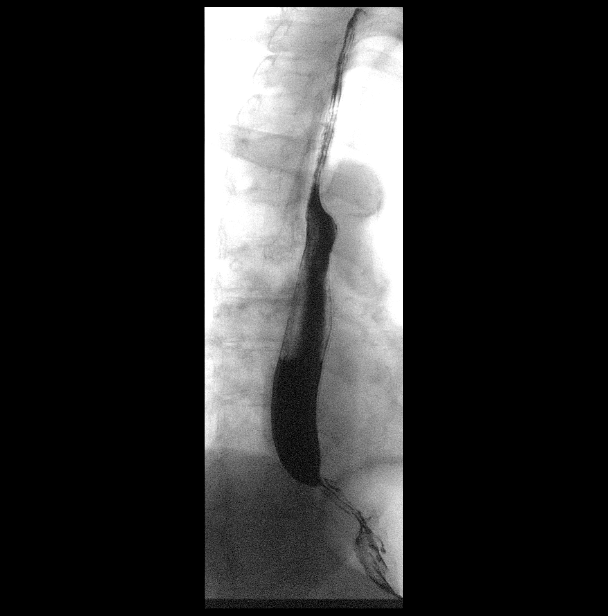

[Series 8: cp_standard · 0.26mm/px · 1 of 1 slices shown (4 of 13)]
[im 1/1]
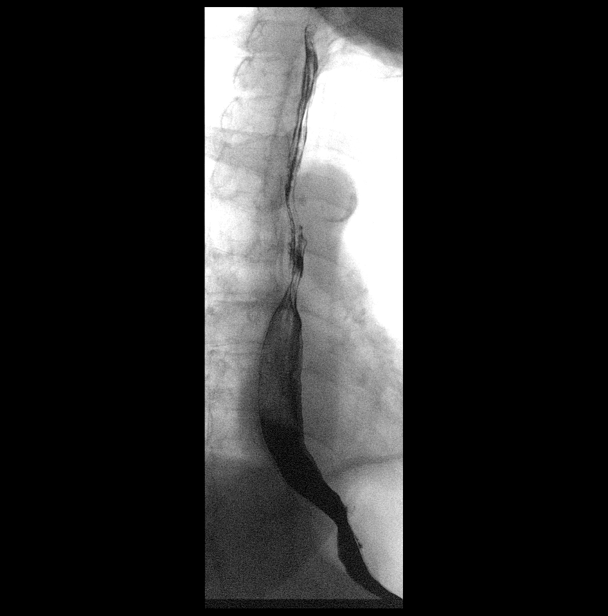

[Series 10: cp_standard · 0.26mm/px · 1 of 1 slices shown (5 of 13)]
[im 1/1]
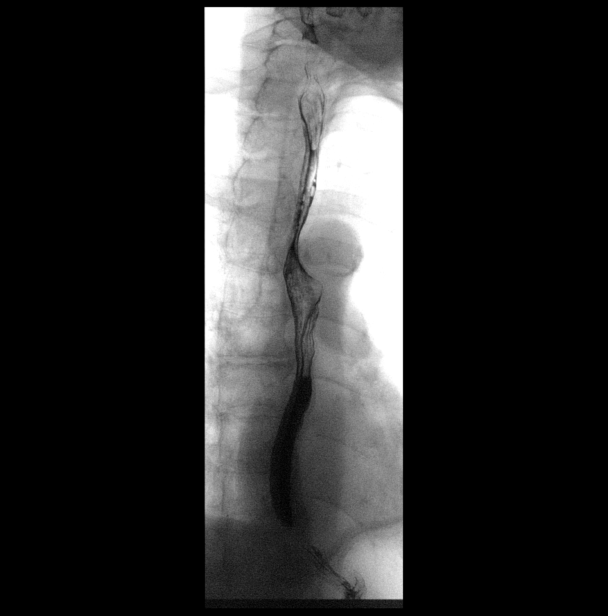

[Series 12: cp_standard · 0.28mm/px · 1 of 1 slices shown (6 of 13)]
[im 1/1]
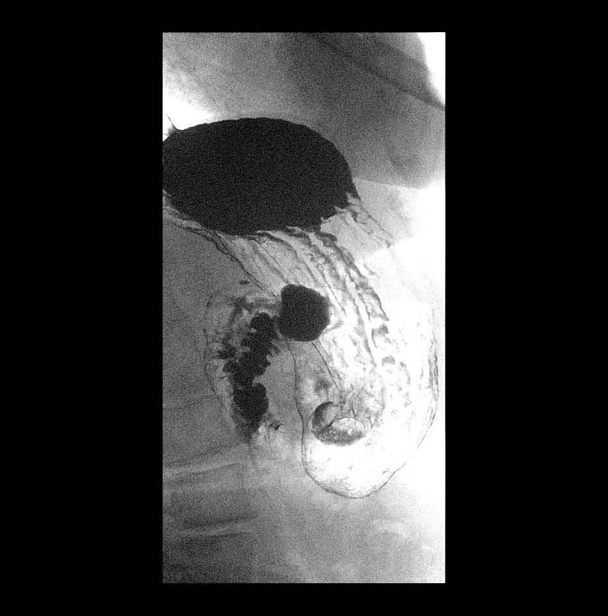

[Series 13: cp_standard · 0.29mm/px · 1 of 1 slices shown (7 of 13)]
[im 1/1]
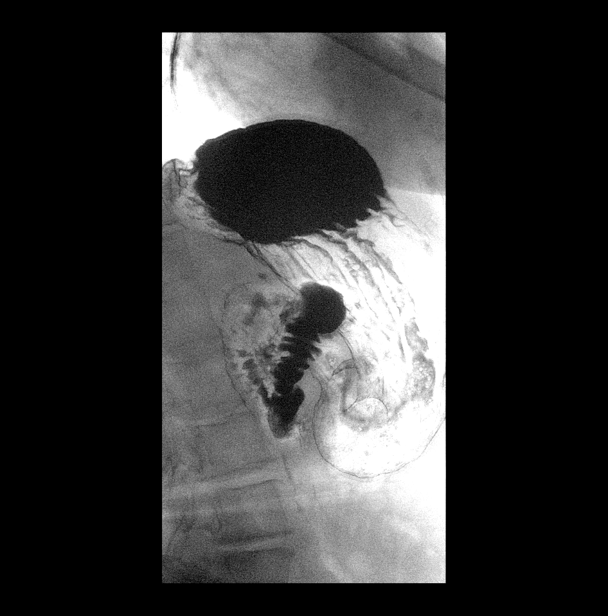

[Series 15: cp_standard · 0.29mm/px · 1 of 1 slices shown (8 of 13)]
[im 1/1]
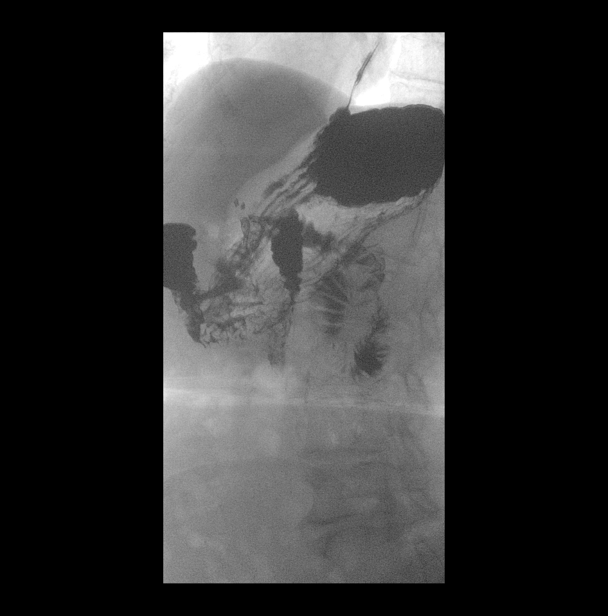

[Series 17: cp_standard · 0.29mm/px · 1 of 1 slices shown (9 of 13)]
[im 1/1]
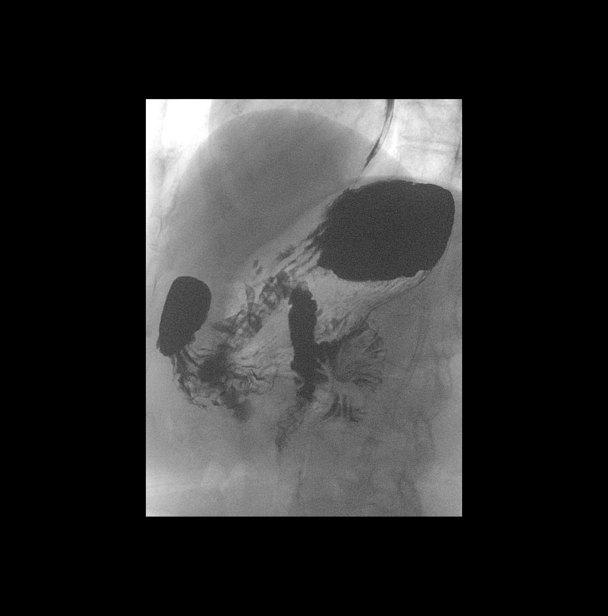

[Series 19: cp_standard · 0.29mm/px · 1 of 1 slices shown (10 of 13)]
[im 1/1]
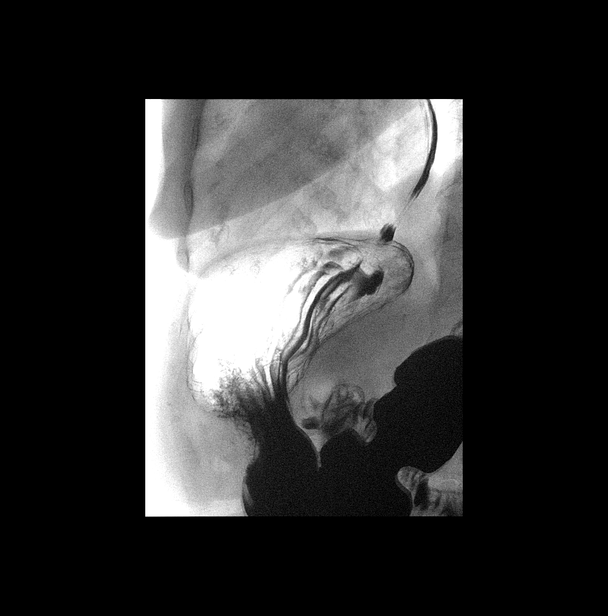

[Series 20: cp_standard · 0.29mm/px · 1 of 1 slices shown (11 of 13)]
[im 1/1]
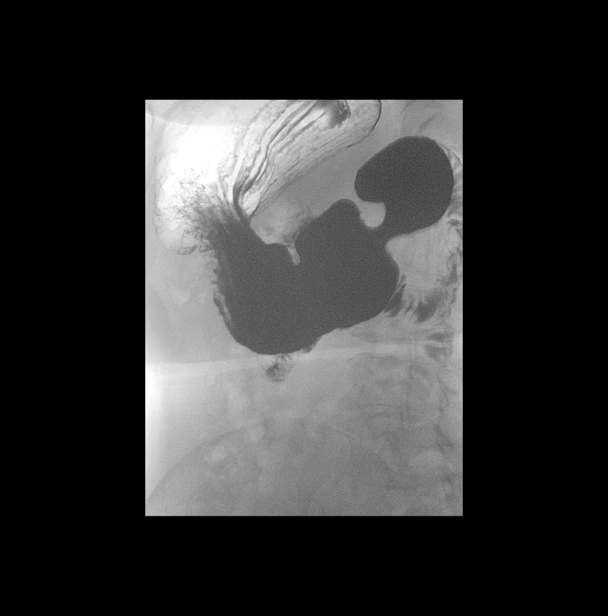

[Series 22: cp_standard · 0.29mm/px · 1 of 1 slices shown (12 of 13)]
[im 1/1]
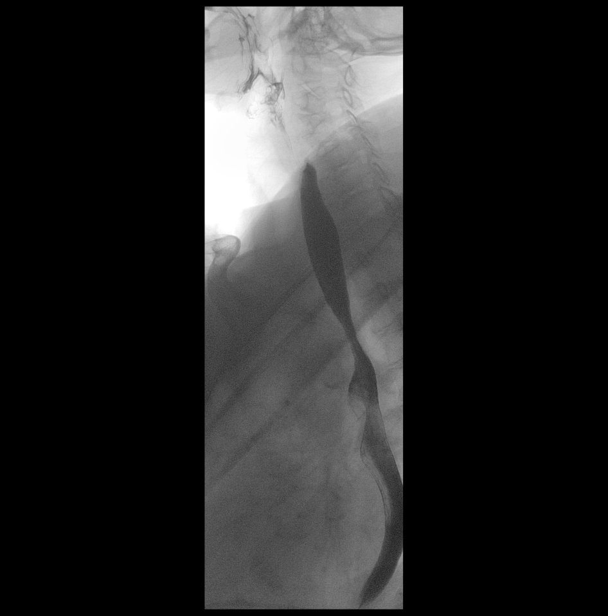

[Series 24: cp_standard · 0.29mm/px · 1 of 1 slices shown (13 of 13)]
[im 1/1]
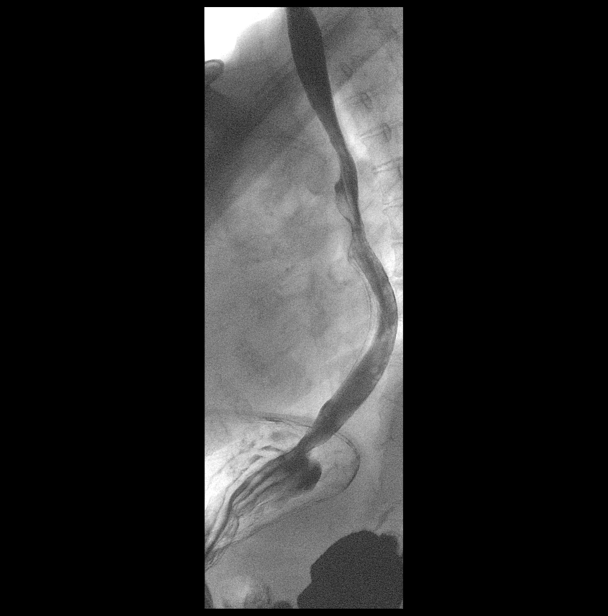

[14 of 24 positions shown; findings below may reference images not displayed]

FINDINGS: KUB:

There is a moderate amount of stool throughout the colon. There is
no bowel dilatation to suggest obstruction. There is no evidence of
pneumoperitoneum, portal venous gas or pneumatosis.

There are no pathologic calcifications along the expected course of
the ureters.

The osseous structures are unremarkable.

UPPER GI:

Examination of the esophagus demonstrated normal esophageal
motility. Normal esophageal morphology without evidence of
esophagitis or ulceration. No esophageal stricture, diverticula, or
mass lesion. No evidence of hiatal hernia. There is no spontaneous
or inducible gastroesophageal reflux.

Examination of the stomach demonstrated normal rugal folds and areae
gastricae. The gastric mucosa appeared unremarkable without evidence
of ulceration, scarring, or mass lesion. Gastric motility and
emptying was normal. Fluoroscopic examination of the duodenum
demonstrates normal motility and morphology without evidence of
ulceration or mass lesion.
IMPRESSION: 1. NORMAL UPPER GI.

## 2017-09-29 DIAGNOSIS — L438 Other lichen planus: Secondary | ICD-10-CM | POA: Diagnosis not present

## 2017-10-06 DIAGNOSIS — J309 Allergic rhinitis, unspecified: Secondary | ICD-10-CM | POA: Diagnosis not present

## 2017-10-06 DIAGNOSIS — I10 Essential (primary) hypertension: Secondary | ICD-10-CM | POA: Diagnosis not present

## 2017-10-06 DIAGNOSIS — E785 Hyperlipidemia, unspecified: Secondary | ICD-10-CM | POA: Diagnosis not present

## 2017-11-07 DIAGNOSIS — J310 Chronic rhinitis: Secondary | ICD-10-CM | POA: Diagnosis not present

## 2017-11-07 DIAGNOSIS — H1045 Other chronic allergic conjunctivitis: Secondary | ICD-10-CM | POA: Diagnosis not present

## 2017-11-17 DIAGNOSIS — L438 Other lichen planus: Secondary | ICD-10-CM | POA: Diagnosis not present

## 2018-01-05 DIAGNOSIS — I10 Essential (primary) hypertension: Secondary | ICD-10-CM | POA: Diagnosis not present

## 2018-01-05 DIAGNOSIS — E785 Hyperlipidemia, unspecified: Secondary | ICD-10-CM | POA: Diagnosis not present

## 2018-01-05 DIAGNOSIS — M8588 Other specified disorders of bone density and structure, other site: Secondary | ICD-10-CM | POA: Diagnosis not present

## 2018-01-05 DIAGNOSIS — Z0000 Encounter for general adult medical examination without abnormal findings: Secondary | ICD-10-CM | POA: Diagnosis not present

## 2018-03-02 DIAGNOSIS — M8588 Other specified disorders of bone density and structure, other site: Secondary | ICD-10-CM | POA: Diagnosis not present

## 2018-05-04 ENCOUNTER — Other Ambulatory Visit: Payer: Self-pay | Admitting: Family Medicine

## 2018-05-04 DIAGNOSIS — Z1231 Encounter for screening mammogram for malignant neoplasm of breast: Secondary | ICD-10-CM

## 2018-05-16 DIAGNOSIS — Z23 Encounter for immunization: Secondary | ICD-10-CM | POA: Diagnosis not present

## 2018-06-01 ENCOUNTER — Ambulatory Visit
Admission: RE | Admit: 2018-06-01 | Discharge: 2018-06-01 | Disposition: A | Discharge status: Home / Self Care | Payer: Medicare Other | Source: Ambulatory Visit | Attending: Family Medicine | Admitting: Family Medicine

## 2018-06-01 DIAGNOSIS — Z1231 Encounter for screening mammogram for malignant neoplasm of breast: Secondary | ICD-10-CM

## 2018-06-25 DIAGNOSIS — H9209 Otalgia, unspecified ear: Secondary | ICD-10-CM | POA: Diagnosis not present

## 2018-06-27 DIAGNOSIS — H6010 Cellulitis of external ear, unspecified ear: Secondary | ICD-10-CM | POA: Diagnosis not present

## 2018-06-28 DIAGNOSIS — I10 Essential (primary) hypertension: Secondary | ICD-10-CM | POA: Diagnosis not present

## 2018-06-28 DIAGNOSIS — L0390 Cellulitis, unspecified: Secondary | ICD-10-CM | POA: Diagnosis not present

## 2018-07-04 DIAGNOSIS — J309 Allergic rhinitis, unspecified: Secondary | ICD-10-CM | POA: Diagnosis not present

## 2018-07-04 DIAGNOSIS — E785 Hyperlipidemia, unspecified: Secondary | ICD-10-CM | POA: Diagnosis not present

## 2018-07-04 DIAGNOSIS — I10 Essential (primary) hypertension: Secondary | ICD-10-CM | POA: Diagnosis not present

## 2018-09-20 DIAGNOSIS — I10 Essential (primary) hypertension: Secondary | ICD-10-CM | POA: Diagnosis not present

## 2018-10-12 DIAGNOSIS — I10 Essential (primary) hypertension: Secondary | ICD-10-CM | POA: Diagnosis not present

## 2019-01-19 DIAGNOSIS — Z0000 Encounter for general adult medical examination without abnormal findings: Secondary | ICD-10-CM | POA: Diagnosis not present

## 2019-01-19 DIAGNOSIS — E785 Hyperlipidemia, unspecified: Secondary | ICD-10-CM | POA: Diagnosis not present

## 2019-01-19 DIAGNOSIS — E559 Vitamin D deficiency, unspecified: Secondary | ICD-10-CM | POA: Diagnosis not present

## 2019-01-22 DIAGNOSIS — I10 Essential (primary) hypertension: Secondary | ICD-10-CM | POA: Diagnosis not present

## 2019-01-22 DIAGNOSIS — M8588 Other specified disorders of bone density and structure, other site: Secondary | ICD-10-CM | POA: Diagnosis not present

## 2019-01-22 DIAGNOSIS — E785 Hyperlipidemia, unspecified: Secondary | ICD-10-CM | POA: Diagnosis not present

## 2019-01-22 DIAGNOSIS — Z0000 Encounter for general adult medical examination without abnormal findings: Secondary | ICD-10-CM | POA: Diagnosis not present

## 2019-04-25 ENCOUNTER — Other Ambulatory Visit: Payer: Self-pay | Admitting: Family Medicine

## 2019-04-25 DIAGNOSIS — Z1231 Encounter for screening mammogram for malignant neoplasm of breast: Secondary | ICD-10-CM

## 2019-06-08 ENCOUNTER — Ambulatory Visit
Admission: RE | Admit: 2019-06-08 | Discharge: 2019-06-08 | Disposition: A | Discharge status: Home / Self Care | Payer: Medicare Other | Source: Ambulatory Visit | Attending: Family Medicine | Admitting: Family Medicine

## 2019-06-08 ENCOUNTER — Other Ambulatory Visit: Payer: Self-pay

## 2019-06-08 DIAGNOSIS — Z1231 Encounter for screening mammogram for malignant neoplasm of breast: Secondary | ICD-10-CM

## 2019-07-24 DIAGNOSIS — J309 Allergic rhinitis, unspecified: Secondary | ICD-10-CM | POA: Diagnosis not present

## 2019-07-24 DIAGNOSIS — I10 Essential (primary) hypertension: Secondary | ICD-10-CM | POA: Diagnosis not present

## 2019-07-24 DIAGNOSIS — E785 Hyperlipidemia, unspecified: Secondary | ICD-10-CM | POA: Diagnosis not present

## 2019-09-17 ENCOUNTER — Ambulatory Visit: Payer: Medicare Other | Attending: Internal Medicine

## 2019-09-17 DIAGNOSIS — Z23 Encounter for immunization: Secondary | ICD-10-CM

## 2019-09-17 NOTE — Progress Notes (Signed)
   Covid-19 Vaccination Clinic  Name:  Brenda Holmes    MRN: [id number] DOB: 1929-11-06  09/17/2019  Ms. Brenda Holmes was observed post Covid-19 immunization for 15 minutes without incidence. She was provided with Vaccine Information Sheet and instruction to access the V-Safe system.   Ms. Brenda Holmes was instructed to call 911 with any severe reactions post vaccine: Marland Kitchen Difficulty breathing  . Swelling of your face and throat  . A fast heartbeat  . A bad rash all over your body  . Dizziness and weakness    Immunizations Administered    Name Date Dose VIS Date Route   Pfizer COVID-19 Vaccine 09/17/2019  9:41 AM 0.3 mL 08/03/2019 Intramuscular   Manufacturer: [place]   Lot: [id number]   [place]: [id number]

## 2019-10-04 DIAGNOSIS — J310 Chronic rhinitis: Secondary | ICD-10-CM | POA: Diagnosis not present

## 2019-10-04 DIAGNOSIS — H1045 Other chronic allergic conjunctivitis: Secondary | ICD-10-CM | POA: Diagnosis not present

## 2019-10-04 DIAGNOSIS — R040 Epistaxis: Secondary | ICD-10-CM | POA: Diagnosis not present

## 2019-10-08 ENCOUNTER — Ambulatory Visit: Payer: Medicare Other | Attending: Internal Medicine

## 2019-10-08 DIAGNOSIS — Z23 Encounter for immunization: Secondary | ICD-10-CM

## 2019-10-08 NOTE — Progress Notes (Signed)
   Covid-19 Vaccination Clinic  Name:  Brenda Holmes    MRN: [id number] DOB: 12/26/1929  10/08/2019  Ms. Brenda Holmes was observed post Covid-19 immunization for 15 minutes without incidence. She was provided with Vaccine Information Sheet and instruction to access the V-Safe system.   Ms. Brenda Holmes was instructed to call 911 with any severe reactions post vaccine: Marland Kitchen Difficulty breathing  . Swelling of your face and throat  . A fast heartbeat  . A bad rash all over your body  . Dizziness and weakness    Immunizations Administered    Name Date Dose VIS Date Route   Pfizer COVID-19 Vaccine 10/08/2019 10:17 AM 0.3 mL 08/03/2019 Intramuscular   Manufacturer: [place]   Lot: [id number]   [place]: [id number]

## 2019-10-16 DIAGNOSIS — R300 Dysuria: Secondary | ICD-10-CM | POA: Diagnosis not present

## 2019-10-16 DIAGNOSIS — R350 Frequency of micturition: Secondary | ICD-10-CM | POA: Diagnosis not present

## 2019-10-26 DIAGNOSIS — H524 Presbyopia: Secondary | ICD-10-CM | POA: Diagnosis not present

## 2020-01-14 DIAGNOSIS — Z0120 Encounter for dental examination and cleaning without abnormal findings: Secondary | ICD-10-CM | POA: Diagnosis not present

## 2020-01-24 DIAGNOSIS — Z0000 Encounter for general adult medical examination without abnormal findings: Secondary | ICD-10-CM | POA: Diagnosis not present

## 2020-01-24 DIAGNOSIS — E785 Hyperlipidemia, unspecified: Secondary | ICD-10-CM | POA: Diagnosis not present

## 2020-01-24 DIAGNOSIS — I10 Essential (primary) hypertension: Secondary | ICD-10-CM | POA: Diagnosis not present

## 2020-01-24 DIAGNOSIS — M8588 Other specified disorders of bone density and structure, other site: Secondary | ICD-10-CM | POA: Diagnosis not present

## 2020-01-25 ENCOUNTER — Other Ambulatory Visit: Payer: Self-pay | Admitting: Family Medicine

## 2020-01-25 DIAGNOSIS — M8580 Other specified disorders of bone density and structure, unspecified site: Secondary | ICD-10-CM

## 2020-03-14 ENCOUNTER — Other Ambulatory Visit: Payer: Medicare Other

## 2020-04-16 DIAGNOSIS — M79672 Pain in left foot: Secondary | ICD-10-CM | POA: Diagnosis not present

## 2020-04-16 DIAGNOSIS — L603 Nail dystrophy: Secondary | ICD-10-CM | POA: Diagnosis not present

## 2020-04-16 DIAGNOSIS — M79671 Pain in right foot: Secondary | ICD-10-CM | POA: Diagnosis not present

## 2020-04-16 DIAGNOSIS — D492 Neoplasm of unspecified behavior of bone, soft tissue, and skin: Secondary | ICD-10-CM | POA: Diagnosis not present

## 2020-04-30 DIAGNOSIS — L814 Other melanin hyperpigmentation: Secondary | ICD-10-CM | POA: Diagnosis not present

## 2020-04-30 DIAGNOSIS — D485 Neoplasm of uncertain behavior of skin: Secondary | ICD-10-CM | POA: Diagnosis not present

## 2020-05-06 ENCOUNTER — Other Ambulatory Visit: Payer: Self-pay | Admitting: Family Medicine

## 2020-05-06 DIAGNOSIS — Z1231 Encounter for screening mammogram for malignant neoplasm of breast: Secondary | ICD-10-CM

## 2020-05-15 ENCOUNTER — Other Ambulatory Visit: Payer: Medicare Other

## 2020-06-03 DIAGNOSIS — Z23 Encounter for immunization: Secondary | ICD-10-CM | POA: Diagnosis not present

## 2020-06-09 ENCOUNTER — Ambulatory Visit: Payer: Medicare Other

## 2020-06-23 DIAGNOSIS — Z0120 Encounter for dental examination and cleaning without abnormal findings: Secondary | ICD-10-CM | POA: Diagnosis not present

## 2020-07-07 ENCOUNTER — Ambulatory Visit
Admission: RE | Admit: 2020-07-07 | Discharge: 2020-07-07 | Disposition: A | Discharge status: Home / Self Care | Payer: Medicare Other | Source: Ambulatory Visit | Attending: Family Medicine | Admitting: Family Medicine

## 2020-07-07 ENCOUNTER — Other Ambulatory Visit: Payer: Self-pay

## 2020-07-07 DIAGNOSIS — Z1231 Encounter for screening mammogram for malignant neoplasm of breast: Secondary | ICD-10-CM | POA: Diagnosis not present

## 2020-07-25 DIAGNOSIS — I10 Essential (primary) hypertension: Secondary | ICD-10-CM | POA: Diagnosis not present

## 2020-07-25 DIAGNOSIS — M549 Dorsalgia, unspecified: Secondary | ICD-10-CM | POA: Diagnosis not present

## 2020-07-25 DIAGNOSIS — E559 Vitamin D deficiency, unspecified: Secondary | ICD-10-CM | POA: Diagnosis not present

## 2020-07-25 DIAGNOSIS — E785 Hyperlipidemia, unspecified: Secondary | ICD-10-CM | POA: Diagnosis not present

## 2020-08-26 ENCOUNTER — Other Ambulatory Visit: Payer: Medicare Other

## 2020-09-23 ENCOUNTER — Other Ambulatory Visit: Payer: Medicare Other

## 2020-10-28 DIAGNOSIS — I10 Essential (primary) hypertension: Secondary | ICD-10-CM | POA: Diagnosis not present

## 2020-10-28 DIAGNOSIS — E785 Hyperlipidemia, unspecified: Secondary | ICD-10-CM | POA: Diagnosis not present

## 2020-12-12 DIAGNOSIS — E785 Hyperlipidemia, unspecified: Secondary | ICD-10-CM | POA: Diagnosis not present

## 2020-12-12 DIAGNOSIS — Z853 Personal history of malignant neoplasm of breast: Secondary | ICD-10-CM | POA: Diagnosis not present

## 2020-12-12 DIAGNOSIS — I10 Essential (primary) hypertension: Secondary | ICD-10-CM | POA: Diagnosis not present

## 2021-01-09 DIAGNOSIS — R2689 Other abnormalities of gait and mobility: Secondary | ICD-10-CM | POA: Diagnosis not present

## 2021-01-09 DIAGNOSIS — M549 Dorsalgia, unspecified: Secondary | ICD-10-CM | POA: Diagnosis not present

## 2021-01-09 DIAGNOSIS — M6281 Muscle weakness (generalized): Secondary | ICD-10-CM | POA: Diagnosis not present

## 2021-01-21 DIAGNOSIS — M6281 Muscle weakness (generalized): Secondary | ICD-10-CM | POA: Diagnosis not present

## 2021-01-21 DIAGNOSIS — M549 Dorsalgia, unspecified: Secondary | ICD-10-CM | POA: Diagnosis not present

## 2021-01-21 DIAGNOSIS — R2689 Other abnormalities of gait and mobility: Secondary | ICD-10-CM | POA: Diagnosis not present

## 2021-01-27 DIAGNOSIS — R2689 Other abnormalities of gait and mobility: Secondary | ICD-10-CM | POA: Diagnosis not present

## 2021-01-27 DIAGNOSIS — M6281 Muscle weakness (generalized): Secondary | ICD-10-CM | POA: Diagnosis not present

## 2021-01-27 DIAGNOSIS — M549 Dorsalgia, unspecified: Secondary | ICD-10-CM | POA: Diagnosis not present

## 2021-01-29 DIAGNOSIS — M6281 Muscle weakness (generalized): Secondary | ICD-10-CM | POA: Diagnosis not present

## 2021-01-29 DIAGNOSIS — R2689 Other abnormalities of gait and mobility: Secondary | ICD-10-CM | POA: Diagnosis not present

## 2021-01-29 DIAGNOSIS — M549 Dorsalgia, unspecified: Secondary | ICD-10-CM | POA: Diagnosis not present

## 2021-02-03 DIAGNOSIS — R2689 Other abnormalities of gait and mobility: Secondary | ICD-10-CM | POA: Diagnosis not present

## 2021-02-03 DIAGNOSIS — M6281 Muscle weakness (generalized): Secondary | ICD-10-CM | POA: Diagnosis not present

## 2021-02-03 DIAGNOSIS — M549 Dorsalgia, unspecified: Secondary | ICD-10-CM | POA: Diagnosis not present

## 2021-02-05 DIAGNOSIS — M6281 Muscle weakness (generalized): Secondary | ICD-10-CM | POA: Diagnosis not present

## 2021-02-05 DIAGNOSIS — M549 Dorsalgia, unspecified: Secondary | ICD-10-CM | POA: Diagnosis not present

## 2021-02-05 DIAGNOSIS — R2689 Other abnormalities of gait and mobility: Secondary | ICD-10-CM | POA: Diagnosis not present

## 2021-03-04 DIAGNOSIS — I10 Essential (primary) hypertension: Secondary | ICD-10-CM | POA: Diagnosis not present

## 2021-03-04 DIAGNOSIS — E559 Vitamin D deficiency, unspecified: Secondary | ICD-10-CM | POA: Diagnosis not present

## 2021-03-04 DIAGNOSIS — M8588 Other specified disorders of bone density and structure, other site: Secondary | ICD-10-CM | POA: Diagnosis not present

## 2021-03-04 DIAGNOSIS — R609 Edema, unspecified: Secondary | ICD-10-CM | POA: Diagnosis not present

## 2021-03-04 DIAGNOSIS — Z0000 Encounter for general adult medical examination without abnormal findings: Secondary | ICD-10-CM | POA: Diagnosis not present

## 2021-03-04 DIAGNOSIS — E785 Hyperlipidemia, unspecified: Secondary | ICD-10-CM | POA: Diagnosis not present

## 2021-03-06 ENCOUNTER — Other Ambulatory Visit: Payer: Self-pay | Admitting: Family Medicine

## 2021-03-06 DIAGNOSIS — M8580 Other specified disorders of bone density and structure, unspecified site: Secondary | ICD-10-CM

## 2021-03-10 DIAGNOSIS — N3000 Acute cystitis without hematuria: Secondary | ICD-10-CM | POA: Diagnosis not present

## 2021-03-11 ENCOUNTER — Other Ambulatory Visit: Payer: Self-pay | Admitting: Family Medicine

## 2021-03-11 DIAGNOSIS — Z1231 Encounter for screening mammogram for malignant neoplasm of breast: Secondary | ICD-10-CM

## 2021-05-12 DIAGNOSIS — L309 Dermatitis, unspecified: Secondary | ICD-10-CM | POA: Diagnosis not present

## 2021-05-12 DIAGNOSIS — L272 Dermatitis due to ingested food: Secondary | ICD-10-CM | POA: Diagnosis not present

## 2021-07-09 ENCOUNTER — Other Ambulatory Visit: Payer: Self-pay

## 2021-07-09 ENCOUNTER — Ambulatory Visit
Admission: RE | Admit: 2021-07-09 | Discharge: 2021-07-09 | Disposition: A | Discharge status: Home / Self Care | Payer: Medicare Other | Source: Ambulatory Visit | Attending: Family Medicine | Admitting: Family Medicine

## 2021-07-09 DIAGNOSIS — Z1231 Encounter for screening mammogram for malignant neoplasm of breast: Secondary | ICD-10-CM

## 2021-07-10 DIAGNOSIS — I10 Essential (primary) hypertension: Secondary | ICD-10-CM | POA: Diagnosis not present

## 2021-07-10 DIAGNOSIS — Z853 Personal history of malignant neoplasm of breast: Secondary | ICD-10-CM | POA: Diagnosis not present

## 2021-07-10 DIAGNOSIS — E785 Hyperlipidemia, unspecified: Secondary | ICD-10-CM | POA: Diagnosis not present

## 2021-08-11 DIAGNOSIS — I10 Essential (primary) hypertension: Secondary | ICD-10-CM | POA: Diagnosis not present

## 2021-08-11 DIAGNOSIS — E785 Hyperlipidemia, unspecified: Secondary | ICD-10-CM | POA: Diagnosis not present

## 2021-08-11 DIAGNOSIS — Z853 Personal history of malignant neoplasm of breast: Secondary | ICD-10-CM | POA: Diagnosis not present

## 2021-09-01 ENCOUNTER — Other Ambulatory Visit: Payer: Medicare Other

## 2021-09-02 DIAGNOSIS — E785 Hyperlipidemia, unspecified: Secondary | ICD-10-CM | POA: Diagnosis not present

## 2021-09-02 DIAGNOSIS — I10 Essential (primary) hypertension: Secondary | ICD-10-CM | POA: Diagnosis not present

## 2021-09-02 DIAGNOSIS — E559 Vitamin D deficiency, unspecified: Secondary | ICD-10-CM | POA: Diagnosis not present

## 2021-09-02 DIAGNOSIS — R413 Other amnesia: Secondary | ICD-10-CM | POA: Diagnosis not present

## 2021-09-16 ENCOUNTER — Encounter: Payer: Self-pay | Admitting: Physician Assistant

## 2021-09-16 DIAGNOSIS — J310 Chronic rhinitis: Secondary | ICD-10-CM | POA: Diagnosis not present

## 2021-09-23 ENCOUNTER — Encounter: Payer: Self-pay | Admitting: Physician Assistant

## 2021-09-23 ENCOUNTER — Other Ambulatory Visit: Payer: Self-pay

## 2021-09-23 ENCOUNTER — Ambulatory Visit: Payer: Medicare Other | Admitting: Physician Assistant

## 2021-09-23 VITALS — BP 158/60 | HR 60 | Ht 63.0 in | Wt 134.0 lb

## 2021-09-23 DIAGNOSIS — F0280 Dementia in other diseases classified elsewhere without behavioral disturbance: Secondary | ICD-10-CM

## 2021-09-23 DIAGNOSIS — G309 Alzheimer's disease, unspecified: Secondary | ICD-10-CM | POA: Diagnosis not present

## 2021-09-23 MED ORDER — MEMANTINE HCL 10 MG PO TABS: ORAL_TABLET | ORAL | 11 refills | Status: DC

## 2021-09-23 NOTE — Progress Notes (Signed)
Assessment/Plan:   Brenda Holmes is a very pleasant 88 y.o. year old RH female with  a history of hypertension, hyperlipidemia, borderline low heart rate , seen today for evaluation of memory loss. MoCA today 17/30 with deficiencies in most areas, delayed recall 1/5, visuospatial 1/5, and naming 1/3.  Normal orientation.  Findings are suspicious for mild to moderate dementia due to Alzheimer's disease.    Recommendations:   Late onset Alzheimer's dementia without behavioral disturbance  MRI brain with/without contrast to assess for underlying structural abnormality and assess vascular load  Discussed safety both in and out of the home.  Start Memantine 10 mg: Take 1 tablet (10 mg at night) for 2 weeks, then increase to 1 tablet (10 mg) twice a day   Discussed the importance of regular daily schedule with inclusion of crossword puzzles to maintain brain function.  Continue to monitor mood with PCP.  Stay active at least 30 minutes at least 3 times a week.  Naps should be scheduled and should be no longer than 60 minutes and should not occur after 2 PM.  Control cardiovascular risk factors , BP today slightly elevated Mediterranean diet is recommended  Strongly recommend no further long distance driving for safety [place] once results above are available   Subjective:    The patient is seen in neurologic consultation at the request of London Pepper, MD for the evaluation of memory.  The patient is here alone.  This is a 88 y.o. year old RH  female who has had memory issues for about 5 years, when she began to forget recent conversations or activities.  This has slowly become worse.  She attributes this to age changes and "I am not worried about it, I talk to my friends and will laugh it off ".  She was sent by her PCP, "because I do not see anything wrong with me".  She denies repeating herself or asking the same questions-although during this visit she did very frequently-.  She  denies being disoriented.  She denies leaving objects in unusual places.  She continues to drive and wants to drive to [place] in the summer, "nothing is going to stop me, as long as I have the GPS I am okay ".  She lives with her husband who "did not notice anything wrong with me ".  She denies depression or irritability.  She does crossword puzzles and word finding on a regular basis.  She sleeps well, denies any vivid dreams or sleepwalking, hallucinations or paranoia.  There are no hygiene concerns, she is independent of bathing and dressing.  She has her medications and pillbox, and denies missing any doses.  She still cares about her own finances, denies missing any payments.  Her appetite is good, and drinks plenty water.  Denies trouble swallowing.  She cooks and denies leaving the stove on.  She ambulates with mild difficulty, uses a cane.  Of note, she exercises at [employer], but she could not recall the name of it calling it "the gym ".  She also says that she likes to drive to "the big store "when referring to [facility].  She denies any headaches, double vision, dizziness, focal numbness or tingling, unilateral weakness, tremors, anosmia, or seizures.  Denies significant urine incontinence, retention, occasional constipation and diarrhea.  Denies sleep apnea, alcohol or tobacco.  Family history remarkable for father with Alzheimer's disease.  The patient is a retired Research scientist (physical sciences) in 1979.  She  is originally from [place].      Allergies  Allergen Reactions   Gluten Meal Nausea And Vomiting   Lactose Intolerance (Gi) Nausea And Vomiting   Neosporin [Neomycin-Bacitracin Zn-Polymyx] Swelling    Current Outpatient Medications  Medication Instructions   amLODipine (NORVASC) 5 MG tablet No dose, route, or frequency recorded.   aspirin EC 81 mg, Oral, Daily   calcium carbonate (OSCAL) 1,500 mg, Oral, 2 times daily with meals   diphenhydrAMINE-APAP, sleep, (TYLENOL PM EXTRA STRENGTH  PO) Oral   ipratropium (ATROVENT) 0.03 % nasal spray 2 sprays, Each Nare, Every 12 hours   loratadine (CLARITIN) 10 mg, Oral, Daily   metoprolol succinate (TOPROL-XL) 25 mg, Oral, Daily   Multiple Vitamins-Minerals (MULTIVITAMIN ADULT PO) 0.5 tablets, Oral, 2 times daily   Polyethylene Glycol 3350 (MIRALAX PO) Oral   rosuvastatin (CRESTOR) 5 mg, Oral, Every other day   VITAMIN D PO 400 Units, Oral, Daily, Take two capsules twice daily   Wheat Dextrin (BENEFIBER PO) Oral     VITALS:   Vitals:   09/23/21 1323  BP: (!) 158/60  Pulse: 60  SpO2: 100%  Weight: 134 lb (60.8 kg)  Height: 5\' 3"  (1.6 m)   No flowsheet data found.  PHYSICAL EXAM   HEENT:  Normocephalic, atraumatic. The mucous membranes are moist. The superficial temporal arteries are without ropiness or tenderness. Cardiovascular: Regular rate and rhythm. Lungs: Clear to auscultation bilaterally. Neck: There are no carotid bruits noted bilaterally.  NEUROLOGICAL: [place] Cognitive Assessment  09/23/2021  Visuospatial/ Executive (0/5) 1  Naming (0/3) 1  Attention: Read list of digits (0/2) 2  Attention: Read list of letters (0/1) 1  Attention: Serial 7 subtraction starting at 100 (0/3) 0  Language: Repeat phrase (0/2) 2  Language : Fluency (0/1) 1  Abstraction (0/2) 2  Delayed Recall (0/5) 1  Orientation (0/6) 6  Total 17  Adjusted Score (based on education) 17   No flowsheet data found.  No flowsheet data found.   Orientation:  Alert and oriented to person, place and time. No aphasia or dysarthria. Fund of knowledge is appropriate. Recent and remote memory impaired.  Attention and concentration are normal.  Able to name objects 1/3 and able repeat phrases. Delayed recall 1/5 Cranial nerves: There is good facial symmetry. Extraocular muscles are intact and visual fields are full to confrontational testing. Speech is fluent and clear. Soft palate rises symmetrically and there is no tongue deviation. Hearing is  intact to conversational tone. Tone: Tone is good throughout. Sensation: Sensation is intact to light touch and pinprick throughout. Vibration is intact at the bilateral big toe.There is no extinction with double simultaneous stimulation. There is no sensory dermatomal level identified. Coordination: The patient has no difficulty with RAM's or FNF bilaterally. Normal finger to nose  Motor: Strength is 5/5 in the bilateral upper and lower extremities. There is no pronator drift. There are no fasciculations noted. DTR's: Deep tendon reflexes are 2/4 at the bilateral biceps, triceps, brachioradialis, patella and achilles.  Plantar responses are downgoing bilaterally. Gait and Station: The patient is able to ambulate without difficulty.The patient is able to heel toe walk without any difficulty.The patient is able to ambulate in a tandem fashion. The patient is able to stand in the Romberg position.     Thank you for allowing [place] the opportunity to participate in the care of this nice patient. Please do not hesitate to contact us for any questions or concerns.   Total time spent  on today's visit was 60 minutes, including both face-to-face time and nonface-to-face time.  Time included that spent on review of records (prior notes available to me/labs/imaging if pertinent), discussing treatment and goals, answering patient's questions and coordinating care.  Cc:  London Pepper, MD  Sharene Butters 09/23/2021 2:09 PM

## 2021-09-23 NOTE — Patient Instructions (Addendum)
It was a pleasure to see you today at our office.   Recommendations:  MRI of the brain, the radiology office will call you to arrange you appointment Start Memantine 10 mg tablets.  Take 1 tablet at bedtime for 2 weeks, then 1 tablet twice daily.   Side effects include dizziness, headache, diarrhea or constipation.  Call with any questions or concerns.  Follow up in 3 months  RECOMMENDATIONS FOR ALL PATIENTS WITH MEMORY PROBLEMS: 1. Continue to exercise (Recommend 30 minutes of walking everyday, or 3 hours every week) 2. Increase social interactions - continue going to [place] and enjoy social gatherings with friends and family 3. Eat healthy, avoid fried foods and eat more fruits and vegetables 4. Maintain adequate blood pressure, blood sugar, and blood cholesterol level. Reducing the risk of stroke and cardiovascular disease also helps promoting better memory. 5. Avoid stressful situations. Live a simple life and avoid aggravations. Organize your time and prepare for the next day in anticipation. 6. Sleep well, avoid any interruptions of sleep and avoid any distractions in the bedroom that may interfere with adequate sleep quality 7. Avoid sugar, avoid sweets as there is a strong link between excessive sugar intake, diabetes, and cognitive impairment We discussed the Mediterranean diet, which has been shown to help patients reduce the risk of progressive memory disorders and reduces cardiovascular risk. This includes eating fish, eat fruits and green leafy vegetables, nuts like almonds and hazelnuts, walnuts, and also use olive oil. Avoid fast foods and fried foods as much as possible. Avoid sweets and sugar as sugar use has been linked to worsening of memory function.  There is always a concern of gradual progression of memory problems. If this is the case, then we may need to adjust level of care according to patient needs. Support, both to the patient and caregiver, should then be put into  place.    FALL PRECAUTIONS: Be cautious when walking. Scan the area for obstacles that may increase the risk of trips and falls. When getting up in the mornings, sit up at the edge of the bed for a few minutes before getting out of bed. Consider elevating the bed at the head end to avoid drop of blood pressure when getting up. Walk always in a well-lit room (use night lights in the walls). Avoid area rugs or power cords from appliances in the middle of the walkways. Use a walker or a cane if necessary and consider physical therapy for balance exercise. Get your eyesight checked regularly.  FINANCIAL OVERSIGHT: Supervision, especially oversight when making financial decisions or transactions is also recommended.  HOME SAFETY: Consider the safety of the kitchen when operating appliances like stoves, microwave oven, and blender. Consider having supervision and share cooking responsibilities until no longer able to participate in those. Accidents with firearms and other hazards in the house should be identified and addressed as well.   ABILITY TO BE LEFT ALONE: If patient is unable to contact 911 operator, consider using LifeLine, or when the need is there, arrange for someone to stay with patients. Smoking is a fire hazard, consider supervision or cessation. Risk of wandering should be assessed by caregiver and if detected at any point, supervision and safe proof recommendations should be instituted.  MEDICATION SUPERVISION: Inability to self-administer medication needs to be constantly addressed. Implement a mechanism to ensure safe administration of the medications.   DRIVING: Regarding driving, in patients with progressive memory problems, driving will be impaired. We advise to have someone  else do the driving if trouble finding directions or if minor accidents are reported. Independent driving assessment is available to determine safety of driving.   If you are interested in the driving  assessment, you can contact the following:  [employer] in [place]  [place] [phone] or [phone]    [place] refers to food and lifestyle choices that are based on the traditions of countries located on the [employer]. This way of eating has been shown to help prevent certain conditions and improve outcomes for people who have chronic diseases, like kidney disease and heart disease. What are tips for following this plan? Lifestyle  Cook and eat meals together with your family, when possible. Drink enough fluid to keep your urine clear or pale yellow. Be physically active every day. This includes: Aerobic exercise like running or swimming. Leisure activities like gardening, walking, or housework. Get 7-8 hours of sleep each night. If recommended by your health care provider, drink red wine in moderation. This means 1 glass a day for nonpregnant women and 2 glasses a day for men. A glass of wine equals 5 oz (150 mL). Reading food labels  Check the serving size of packaged foods. For foods such as rice and pasta, the serving size refers to the amount of cooked product, not dry. Check the total fat in packaged foods. Avoid foods that have saturated fat or trans fats. Check the ingredients list for added sugars, such as corn syrup. Shopping  At the grocery store, buy most of your food from the areas near the walls of the store. This includes: Fresh fruits and vegetables (produce). Grains, beans, nuts, and seeds. Some of these may be available in unpackaged forms or large amounts (in bulk). Fresh seafood. Poultry and eggs. Low-fat dairy products. Buy whole ingredients instead of prepackaged foods. Buy fresh fruits and vegetables in-season from local farmers markets. Buy frozen fruits and vegetables in resealable bags. If  you do not have access to quality fresh seafood, buy precooked frozen shrimp or canned fish, such as tuna, salmon, or sardines. Buy small amounts of raw or cooked vegetables, salads, or olives from the deli or salad bar at your store. Stock your pantry so you always have certain foods on hand, such as olive oil, canned tuna, canned tomatoes, rice, pasta, and beans. Cooking  Cook foods with extra-virgin olive oil instead of using butter or other vegetable oils. Have meat as a side dish, and have vegetables or grains as your main dish. This means having meat in small portions or adding small amounts of meat to foods like pasta or stew. Use beans or vegetables instead of meat in common dishes like chili or lasagna. Experiment with different cooking methods. Try roasting or broiling vegetables instead of steaming or sauteing them. Add frozen vegetables to soups, stews, pasta, or rice. Add nuts or seeds for added healthy fat at each meal. You can add these to yogurt, salads, or vegetable dishes. Marinate fish or vegetables using olive oil, lemon juice, garlic, and fresh herbs. Meal planning  Plan to eat 1 vegetarian meal one day each week. Try to work up to 2 vegetarian meals, if possible. Eat seafood 2 or more times a week. Have healthy snacks readily available, such as: Vegetable sticks with hummus. Greek yogurt. Fruit and nut trail mix. Eat balanced meals throughout the week. This includes: Fruit: 2-3 servings a day  Vegetables: 4-5 servings a day Low-fat dairy: 2 servings a day Fish, poultry, or lean meat: 1 serving a day Beans and legumes: 2 or more servings a week Nuts and seeds: 1-2 servings a day Whole grains: 6-8 servings a day Extra-virgin olive oil: 3-4 servings a day Limit red meat and sweets to only a few servings a month What are my food choices? Mediterranean diet Recommended Grains: Whole-grain pasta. Brown rice. Bulgar wheat. Polenta. Couscous. Whole-wheat bread. [place]. Vegetables: Artichokes. Beets. Broccoli. Cabbage. Carrots. Eggplant. Green beans. Chard. Kale. Spinach. Onions. Leeks. Peas. Squash. Tomatoes. Peppers. Radishes. Fruits: Apples. Apricots. Avocado. Berries. Bananas. Cherries. Dates. Figs. Grapes. Lemons. Melon. Oranges. Peaches. Plums. Pomegranate. Meats and other protein foods: Beans. Almonds. Sunflower seeds. Pine nuts. Peanuts. [place]. Salmon. Scallops. Shrimp. [place]. Tilapia. Clams. Oysters. Eggs. Dairy: Low-fat milk. Cheese. Greek yogurt. Beverages: Water. Red wine. Herbal tea. Fats and oils: Extra virgin olive oil. Avocado oil. Grape seed oil. Sweets and desserts: [place] yogurt with honey. Baked apples. Poached pears. Trail mix. Seasoning and other foods: Basil. Cilantro. Coriander. Cumin. Mint. Parsley. Sage. Rosemary. Tarragon. Garlic. Oregano. Thyme. Pepper. Balsalmic vinegar. Tahini. Hummus. Tomato sauce. Olives. Mushrooms. Limit these Grains: Prepackaged pasta or rice dishes. Prepackaged cereal with added sugar. Vegetables: Deep fried potatoes (french fries). Fruits: Fruit canned in syrup. Meats and other protein foods: Beef. Pork. Lamb. Poultry with skin. Hot dogs. [place] Salines. Dairy: Ice cream. Sour cream. Whole milk. Beverages: Juice. Sugar-sweetened soft drinks. Beer. Liquor and spirits. Fats and oils: Butter. Canola oil. Vegetable oil. Beef fat (tallow). Lard. Sweets and desserts: Cookies. Cakes. Pies. Candy. Seasoning and other foods: Mayonnaise. Premade sauces and marinades. The items listed may not be a complete list. Talk with your dietitian about what dietary choices are right for you. Summary The Mediterranean diet includes both food and lifestyle choices. Eat a variety of fresh fruits and vegetables, beans, nuts, seeds, and whole grains. Limit the amount of red meat and sweets that you eat. Talk with your health care provider about whether it is safe for you to drink red wine in moderation. This means 1 glass a day for  nonpregnant women and 2 glasses a day for men. A glass of wine equals 5 oz (150 mL). This information is not intended to replace advice given to you by your health care provider. Make sure you discuss any questions you have with your health care provider. Document Released: 04/01/2016 Document Revised: 05/04/2016 Document Reviewed: 04/01/2016 Elsevier Interactive Patient Education  2017 [employer].

## 2021-10-01 DIAGNOSIS — E785 Hyperlipidemia, unspecified: Secondary | ICD-10-CM | POA: Diagnosis not present

## 2021-10-01 DIAGNOSIS — I10 Essential (primary) hypertension: Secondary | ICD-10-CM | POA: Diagnosis not present

## 2021-10-09 ENCOUNTER — Other Ambulatory Visit: Payer: Medicare Other

## 2021-10-12 ENCOUNTER — Telehealth: Payer: Self-pay

## 2021-10-12 NOTE — Telephone Encounter (Signed)
Mri brain Wo contrast, patient was scheduled at [facility] for 10/09/2021. Patient cancelled appt. FYI

## 2021-10-27 DIAGNOSIS — I10 Essential (primary) hypertension: Secondary | ICD-10-CM | POA: Diagnosis not present

## 2021-10-27 DIAGNOSIS — E785 Hyperlipidemia, unspecified: Secondary | ICD-10-CM | POA: Diagnosis not present

## 2021-12-24 ENCOUNTER — Ambulatory Visit: Payer: Medicare Other | Admitting: Physician Assistant

## 2022-01-29 ENCOUNTER — Other Ambulatory Visit: Payer: Self-pay | Admitting: Family Medicine

## 2022-01-29 DIAGNOSIS — M8580 Other specified disorders of bone density and structure, unspecified site: Secondary | ICD-10-CM

## 2022-03-05 DIAGNOSIS — E559 Vitamin D deficiency, unspecified: Secondary | ICD-10-CM | POA: Diagnosis not present

## 2022-03-05 DIAGNOSIS — R609 Edema, unspecified: Secondary | ICD-10-CM | POA: Diagnosis not present

## 2022-03-05 DIAGNOSIS — I10 Essential (primary) hypertension: Secondary | ICD-10-CM | POA: Diagnosis not present

## 2022-03-05 DIAGNOSIS — M542 Cervicalgia: Secondary | ICD-10-CM | POA: Diagnosis not present

## 2022-03-05 DIAGNOSIS — E785 Hyperlipidemia, unspecified: Secondary | ICD-10-CM | POA: Diagnosis not present

## 2022-03-05 DIAGNOSIS — Z0000 Encounter for general adult medical examination without abnormal findings: Secondary | ICD-10-CM | POA: Diagnosis not present

## 2022-06-07 ENCOUNTER — Other Ambulatory Visit: Payer: Self-pay | Admitting: Family Medicine

## 2022-06-07 DIAGNOSIS — Z1231 Encounter for screening mammogram for malignant neoplasm of breast: Secondary | ICD-10-CM

## 2022-07-13 ENCOUNTER — Ambulatory Visit
Admission: RE | Admit: 2022-07-13 | Discharge: 2022-07-13 | Disposition: A | Discharge status: Home / Self Care | Payer: Medicare Other | Source: Ambulatory Visit | Attending: Family Medicine | Admitting: Family Medicine

## 2022-07-13 DIAGNOSIS — M8589 Other specified disorders of bone density and structure, multiple sites: Secondary | ICD-10-CM | POA: Diagnosis not present

## 2022-07-13 DIAGNOSIS — Z780 Asymptomatic menopausal state: Secondary | ICD-10-CM | POA: Diagnosis not present

## 2022-07-13 DIAGNOSIS — M8580 Other specified disorders of bone density and structure, unspecified site: Secondary | ICD-10-CM

## 2022-07-13 DIAGNOSIS — M810 Age-related osteoporosis without current pathological fracture: Secondary | ICD-10-CM | POA: Diagnosis not present

## 2022-07-23 ENCOUNTER — Ambulatory Visit
Admission: RE | Admit: 2022-07-23 | Discharge: 2022-07-23 | Disposition: A | Discharge status: Home / Self Care | Payer: Medicare Other | Source: Ambulatory Visit | Attending: Family Medicine | Admitting: Family Medicine

## 2022-07-23 DIAGNOSIS — Z1231 Encounter for screening mammogram for malignant neoplasm of breast: Secondary | ICD-10-CM

## 2022-08-03 DIAGNOSIS — I10 Essential (primary) hypertension: Secondary | ICD-10-CM | POA: Diagnosis not present

## 2022-08-03 DIAGNOSIS — M810 Age-related osteoporosis without current pathological fracture: Secondary | ICD-10-CM | POA: Diagnosis not present

## 2022-09-23 DIAGNOSIS — E785 Hyperlipidemia, unspecified: Secondary | ICD-10-CM | POA: Diagnosis not present

## 2022-09-23 DIAGNOSIS — M810 Age-related osteoporosis without current pathological fracture: Secondary | ICD-10-CM | POA: Diagnosis not present

## 2022-09-23 DIAGNOSIS — R413 Other amnesia: Secondary | ICD-10-CM | POA: Diagnosis not present

## 2022-09-23 DIAGNOSIS — I10 Essential (primary) hypertension: Secondary | ICD-10-CM | POA: Diagnosis not present

## 2022-10-01 DIAGNOSIS — M810 Age-related osteoporosis without current pathological fracture: Secondary | ICD-10-CM | POA: Diagnosis not present

## 2022-10-06 ENCOUNTER — Ambulatory Visit: Payer: Medicare Other | Admitting: Physician Assistant

## 2022-10-06 NOTE — Progress Notes (Incomplete)
Assessment/Plan:     Dementia without behavioral disturbance, likely due to Alzheimer's disease.  Brenda Holmes is a very pleasant 89 y.o. RH female e with  a history of hypertension, hyperlipidemia, osteopenia borderline low heart rate , seen today in follow up for memory loss.  She was instructed to return within 3 months after her first visit, but she lost to follow-up.  Patient is currently on memantine 10 mg twice daily.  Patient never proceeded with her recommended imaging of the brain to further evaluate the brain structure and vascular load.    Follow up in   months. Continue memantine 10 mg twice daily, side effects discussed Recommend good control of cardiovascular risk factors Continue to control mood as per PCP    Subjective:    This patient is accompanied in the office by *** who supplements the history.  Previous records as well as any outside records available were reviewed prior to todays visit. Patient was last seen on 09/23/2021, at which time her MoCA was 17/30***   Any changes in memory since last visit? repeats oneself?  Endorsed Disoriented when walking into a room?  Patient denies except occasionally not remembering what patient came to the room for ***  Leaving objects in unusual places?    denies   Wandering behavior?  denies   Any personality changes since last visit?  denies   Any worsening depression?:  denies   Hallucinations or paranoia?  denies   Seizures?    denies    Any sleep changes?  Denies vivid dreams, REM behavior or sleepwalking   Sleep apnea?   denies   Any hygiene concerns?    denies   Independent of bathing and dressing?  Endorsed  Does the patient needs help with medications? is in charge *** Who is in charge of the finances?   is in charge   *** Any changes in appetite?  denies ***   Patient have trouble swallowing?  denies   Does the patient cook?  Any kitchen accidents such as leaving the stove on? Patient denies   Any  headaches?   denies   Chronic back pain  denies   Ambulates with difficulty?     denies   Recent falls or head injuries? denies     Unilateral weakness, numbness or tingling?    denies   Any tremors?  denies   Any anosmia?  Patient denies   Any incontinence of urine?  denies   Any bowel dysfunction?     denies      Patient lives  *** Does the patient drive?***  Initial evaluation 09/23/2021  The patient is seen in neurologic consultation at the request of London Pepper, MD for the evaluation of memory.  The patient is here alone.  This is a 89 y.o. year old RH  female who has had memory issues for about 5 years, when she began to forget recent conversations or activities.  This has slowly become worse.  She attributes this to age changes and "I am not worried about it, I talk to my friends and will laugh it off ".  She was sent by her PCP, "because I do not see anything wrong with me".  She denies repeating herself or asking the same questions-although during this visit she did very frequently-.  She denies being disoriented.  She denies leaving objects in unusual places.  She continues to drive and wants to drive to [place] in the summer, "nothing  is going to stop me, as long as I have the GPS I am okay ".  She lives with her husband who "did not notice anything wrong with me ".  She denies depression or irritability.  She does crossword puzzles and word finding on a regular basis.  She sleeps well, denies any vivid dreams or sleepwalking, hallucinations or paranoia.  There are no hygiene concerns, she is independent of bathing and dressing.  She has her medications and pillbox, and denies missing any doses.  She still cares about her own finances, denies missing any payments.  Her appetite is good, and drinks plenty water.  Denies trouble swallowing.  She cooks and denies leaving the stove on.  She ambulates with mild difficulty, uses a cane.  Of note, she exercises at [employer], but she could  not recall the name of it calling it "the gym ".  She also says that she likes to drive to "the big store "when referring to [facility].  She denies any headaches, double vision, dizziness, focal numbness or tingling, unilateral weakness, tremors, anosmia, or seizures.  Denies significant urine incontinence, retention, occasional constipation and diarrhea.  Denies sleep apnea, alcohol or tobacco.  Family history remarkable for father with Alzheimer's disease.  The patient is a retired Research scientist (physical sciences) in 1979.  She is originally from [place].  PREVIOUS MEDICATIONS:   CURRENT MEDICATIONS:  Outpatient Encounter Medications as of 10/06/2022  Medication Sig   amLODipine (NORVASC) 5 MG tablet    aspirin EC 81 MG tablet Take 81 mg by mouth daily.   calcium carbonate (OSCAL) 1500 (600 Ca) MG TABS tablet Take 1,500 mg by mouth 2 (two) times daily with a meal.   diphenhydrAMINE-APAP, sleep, (TYLENOL PM EXTRA STRENGTH PO) Take by mouth.   ipratropium (ATROVENT) 0.03 % nasal spray Place 2 sprays into both nostrils every 12 (twelve) hours.   loratadine (CLARITIN) 10 MG tablet Take 10 mg by mouth daily.   memantine (NAMENDA) 10 MG tablet Take 1 tablet (10 mg at night) for 2 weeks, then increase to 1 tablet (10 mg) twice a day   metoprolol succinate (TOPROL-XL) 25 MG 24 hr tablet Take 25 mg by mouth daily.   Multiple Vitamins-Minerals (MULTIVITAMIN ADULT PO) Take 0.5 tablets by mouth 2 (two) times daily.   Polyethylene Glycol 3350 (MIRALAX PO) Take by mouth.   rosuvastatin (CRESTOR) 5 MG tablet Take 5 mg by mouth every other day.   VITAMIN D PO Take 400 Units by mouth daily. Take two capsules twice daily   Wheat Dextrin (BENEFIBER PO) Take by mouth.   No facility-administered encounter medications on file as of 10/06/2022.        No data to display            09/23/2021    1:00 PM  Montreal Cognitive Assessment   Visuospatial/ Executive (0/5) 1  Naming (0/3) 1  Attention: Read list of digits  (0/2) 2  Attention: Read list of letters (0/1) 1  Attention: Serial 7 subtraction starting at 100 (0/3) 0  Language: Repeat phrase (0/2) 2  Language : Fluency (0/1) 1  Abstraction (0/2) 2  Delayed Recall (0/5) 1  Orientation (0/6) 6  Total 17  Adjusted Score (based on education) 17    Objective:     PHYSICAL EXAMINATION:    VITALS:  There were no vitals filed for this visit.  GEN:  The patient appears stated age and is in NAD. HEENT:  Normocephalic, atraumatic.  Neurological examination:  General: NAD, well-groomed, appears stated age. Orientation: The patient is alert. Oriented to person, place and date Cranial nerves: There is good facial symmetry.The speech is fluent and clear. No aphasia or dysarthria. Fund of knowledge is appropriate. Recent and remote memory are impaired. Attention and concentration are reduced.  Able to name objects and repeat phrases.  Hearing is intact to conversational tone.    Sensation: Sensation is intact to light touch throughout Motor: Strength is at least antigravity x4. DTR's 2/4 in UE/LE     Movement examination: Tone: There is normal tone in the UE/LE Abnormal movements:  no tremor.  No myoclonus.  No asterixis.   Coordination:  There is no decremation with RAM's. Normal finger to nose  Gait and Station: The patient has no difficulty arising out of a deep-seated chair without the use of the hands. The patient's stride length is good.  Gait is cautious and narrow.    Thank you for allowing [place] the opportunity to participate in the care of this nice patient. Please do not hesitate to contact us for any questions or concerns.   Total time spent on today's visit was *** minutes dedicated to this patient today, preparing to see patient, examining the patient, ordering tests and/or medications and counseling the patient, documenting clinical information in the EHR or other health record, independently interpreting results and communicating  results to the patient/family, discussing treatment and goals, answering patient's questions and coordinating care.  Cc:  London Pepper, MD  Sharene Butters 10/06/2022 6:45 AM

## 2022-10-08 DIAGNOSIS — J310 Chronic rhinitis: Secondary | ICD-10-CM | POA: Diagnosis not present

## 2022-10-08 DIAGNOSIS — R040 Epistaxis: Secondary | ICD-10-CM | POA: Diagnosis not present

## 2022-10-08 DIAGNOSIS — J300 Vasomotor rhinitis: Secondary | ICD-10-CM | POA: Diagnosis not present

## 2022-10-08 DIAGNOSIS — H1045 Other chronic allergic conjunctivitis: Secondary | ICD-10-CM | POA: Diagnosis not present

## 2023-03-14 DIAGNOSIS — I10 Essential (primary) hypertension: Secondary | ICD-10-CM | POA: Diagnosis not present

## 2023-03-14 DIAGNOSIS — R609 Edema, unspecified: Secondary | ICD-10-CM | POA: Diagnosis not present

## 2023-03-14 DIAGNOSIS — M810 Age-related osteoporosis without current pathological fracture: Secondary | ICD-10-CM | POA: Diagnosis not present

## 2023-03-14 DIAGNOSIS — Z0000 Encounter for general adult medical examination without abnormal findings: Secondary | ICD-10-CM | POA: Diagnosis not present

## 2023-03-14 DIAGNOSIS — G309 Alzheimer's disease, unspecified: Secondary | ICD-10-CM | POA: Diagnosis not present

## 2023-03-14 DIAGNOSIS — E785 Hyperlipidemia, unspecified: Secondary | ICD-10-CM | POA: Diagnosis not present

## 2023-03-14 DIAGNOSIS — E559 Vitamin D deficiency, unspecified: Secondary | ICD-10-CM | POA: Diagnosis not present

## 2023-03-14 DIAGNOSIS — F0280 Dementia in other diseases classified elsewhere without behavioral disturbance: Secondary | ICD-10-CM | POA: Diagnosis not present

## 2023-04-18 DIAGNOSIS — K080 Exfoliation of teeth due to systemic causes: Secondary | ICD-10-CM | POA: Diagnosis not present

## 2023-04-28 DIAGNOSIS — Z87891 Personal history of nicotine dependence: Secondary | ICD-10-CM | POA: Diagnosis not present

## 2023-04-28 DIAGNOSIS — C50912 Malignant neoplasm of unspecified site of left female breast: Secondary | ICD-10-CM | POA: Diagnosis not present

## 2023-05-24 ENCOUNTER — Other Ambulatory Visit: Payer: Self-pay | Admitting: Family Medicine

## 2023-05-24 DIAGNOSIS — Z1231 Encounter for screening mammogram for malignant neoplasm of breast: Secondary | ICD-10-CM

## 2023-07-26 ENCOUNTER — Ambulatory Visit: Payer: Medicare Other

## 2023-07-27 ENCOUNTER — Ambulatory Visit
Admission: RE | Admit: 2023-07-27 | Discharge: 2023-07-27 | Disposition: A | Discharge status: Home / Self Care | Payer: Medicare Other | Source: Ambulatory Visit | Attending: Family Medicine | Admitting: Family Medicine

## 2023-07-27 DIAGNOSIS — Z1231 Encounter for screening mammogram for malignant neoplasm of breast: Secondary | ICD-10-CM | POA: Diagnosis not present

## 2023-07-29 ENCOUNTER — Other Ambulatory Visit: Payer: Self-pay | Admitting: Family Medicine

## 2023-07-29 DIAGNOSIS — R928 Other abnormal and inconclusive findings on diagnostic imaging of breast: Secondary | ICD-10-CM

## 2023-08-15 ENCOUNTER — Other Ambulatory Visit: Payer: Self-pay | Admitting: Family Medicine

## 2023-08-15 ENCOUNTER — Other Ambulatory Visit: Payer: Medicare Other

## 2023-08-15 ENCOUNTER — Ambulatory Visit
Admission: RE | Admit: 2023-08-15 | Discharge: 2023-08-15 | Disposition: A | Discharge status: Home / Self Care | Payer: Medicare Other | Source: Ambulatory Visit | Attending: Family Medicine | Admitting: Family Medicine

## 2023-08-15 ENCOUNTER — Inpatient Hospital Stay: Admission: RE | Admit: 2023-08-15 | Payer: Medicare Other | Source: Ambulatory Visit

## 2023-08-15 DIAGNOSIS — N6489 Other specified disorders of breast: Secondary | ICD-10-CM

## 2023-08-15 DIAGNOSIS — R928 Other abnormal and inconclusive findings on diagnostic imaging of breast: Secondary | ICD-10-CM | POA: Diagnosis not present

## 2023-09-14 DIAGNOSIS — R140 Abdominal distension (gaseous): Secondary | ICD-10-CM | POA: Diagnosis not present

## 2023-09-14 DIAGNOSIS — G309 Alzheimer's disease, unspecified: Secondary | ICD-10-CM | POA: Diagnosis not present

## 2023-09-14 DIAGNOSIS — I10 Essential (primary) hypertension: Secondary | ICD-10-CM | POA: Diagnosis not present

## 2023-09-14 DIAGNOSIS — E559 Vitamin D deficiency, unspecified: Secondary | ICD-10-CM | POA: Diagnosis not present

## 2023-09-23 ENCOUNTER — Emergency Department (HOSPITAL_COMMUNITY): Payer: Medicare Other

## 2023-09-23 ENCOUNTER — Emergency Department (HOSPITAL_COMMUNITY)
Admission: EM | Admit: 2023-09-23 | Discharge: 2023-09-24 | Disposition: A | Discharge status: Home / Self Care | Payer: Medicare Other | Attending: Emergency Medicine | Admitting: Emergency Medicine

## 2023-09-23 ENCOUNTER — Encounter (HOSPITAL_COMMUNITY): Payer: Self-pay | Admitting: *Deleted

## 2023-09-23 ENCOUNTER — Other Ambulatory Visit: Payer: Self-pay

## 2023-09-23 DIAGNOSIS — I10 Essential (primary) hypertension: Secondary | ICD-10-CM | POA: Insufficient documentation

## 2023-09-23 DIAGNOSIS — R002 Palpitations: Secondary | ICD-10-CM

## 2023-09-23 DIAGNOSIS — Z7982 Long term (current) use of aspirin: Secondary | ICD-10-CM | POA: Insufficient documentation

## 2023-09-23 DIAGNOSIS — I700 Atherosclerosis of aorta: Secondary | ICD-10-CM | POA: Diagnosis not present

## 2023-09-23 DIAGNOSIS — Z853 Personal history of malignant neoplasm of breast: Secondary | ICD-10-CM | POA: Insufficient documentation

## 2023-09-23 DIAGNOSIS — Z79899 Other long term (current) drug therapy: Secondary | ICD-10-CM | POA: Diagnosis not present

## 2023-09-23 DIAGNOSIS — I491 Atrial premature depolarization: Secondary | ICD-10-CM | POA: Insufficient documentation

## 2023-09-23 DIAGNOSIS — J40 Bronchitis, not specified as acute or chronic: Secondary | ICD-10-CM | POA: Diagnosis not present

## 2023-09-23 LAB — BASIC METABOLIC PANEL
Anion gap: 11 (ref 5–15)
BUN: 13 mg/dL (ref 8–23)
CO2: 22 mmol/L (ref 22–32)
Calcium: 10.1 mg/dL (ref 8.9–10.3)
Chloride: 105 mmol/L (ref 98–111)
Creatinine, Ser: 0.88 mg/dL (ref 0.44–1.00)
GFR, Estimated: >60 mL/min (ref >60)
Glucose, Bld: 129 mg/dL — ABNORMAL HIGH (ref 70–99)
Potassium: 3.5 mmol/L (ref 3.5–5.1)
Sodium: 138 mmol/L (ref 135–145)

## 2023-09-23 LAB — CBC
HCT: 40.4 % (ref 36.0–46.0)
Hemoglobin: 13.8 g/dL (ref 12.0–15.0)
MCH: 30.1 pg (ref 26.0–34.0)
MCHC: 34.2 g/dL (ref 30.0–36.0)
MCV: 88.2 fL (ref 80.0–100.0)
Platelets: 241 10*3/uL (ref 150–400)
RBC: 4.58 MIL/uL (ref 3.87–5.11)
RDW: 12.9 % (ref 11.5–15.5)
WBC: 5.3 10*3/uL (ref 4.0–10.5)
nRBC: 0 % (ref 0.0–0.2)

## 2023-09-23 LAB — MAGNESIUM: Magnesium: 2.2 mg/dL (ref 1.7–2.4)

## 2023-09-23 LAB — TSH: TSH: 2.595 u[IU]/mL (ref 0.350–4.500)

## 2023-09-23 LAB — TROPONIN I (HIGH SENSITIVITY): Troponin I (High Sensitivity): 8 ng/L (ref <18)

## 2023-09-23 NOTE — ED Triage Notes (Signed)
The pt is c/o a rapid heart rate for 2 days  she has also had sob she was sent here from an urgent care

## 2023-09-23 NOTE — ED Provider Triage Note (Cosign Needed)
Emergency Medicine Provider Triage Evaluation Note  Brenda Holmes , a 89 y.o. female  was evaluated in triage.  Pt complains of palpiations.  Review of Systems  Positive:  Negative:   Physical Exam  Ht 5\' 3"  (1.6 m)   Wt 60.8 kg   BMI 23.74 kg/m  Gen:   Awake, no distress   Resp:  Normal effort  MSK:   Moves extremities without difficulty  Other:    Medical Decision Making  Medically screening exam initiated at 9:43 PM.  Appropriate orders placed.  Brenda Holmes was informed that the remainder of the evaluation will be completed by another provider, this initial triage assessment does not replace that evaluation, and the importance of remaining in the ED until their evaluation is complete.  Intermittent palpitations and SOB over the past couple of days. UC did EKG which showed PAC's so they sent patient to ED. No chest pain, nausea, vomiting, diarrhea, cough.   Brenda Holmes, [place] 09/23/23 2144

## 2023-09-24 LAB — TROPONIN I (HIGH SENSITIVITY): Troponin I (High Sensitivity): 10 ng/L (ref <18)

## 2023-09-24 NOTE — ED Provider Notes (Signed)
[place] EMERGENCY DEPARTMENT AT [facility] Provider Note   CSN: [id number] Arrival date & time: 09/23/23  2120     History  Chief Complaint  Patient presents with   Irregular Heart Beat    Brenda Holmes is a 89 y.o. female.  The history is provided by the patient and the spouse.  Patient with history of hypertension presents for palpitations. Patient reports for over 2 days she has felt episodes of her heart fluttering.  No syncope.  No active chest pain.  No change in her medications.  She reports she drinks 1 cup of tea a day.  She has never had this before.  She was seen in urgent care and sent for evaluation.    Past Medical History:  Diagnosis Date   Cancer [facility])    Left breast cancer 1990   Hyperlipidemia    Hypertension    Personal history of chemotherapy     Home Medications Prior to Admission medications   Medication Sig Start Date End Date Taking? Authorizing Provider  amLODipine (NORVASC) 5 MG tablet     [provider]  aspirin EC 81 MG tablet Take 81 mg by mouth daily.    [provider]  calcium carbonate (OSCAL) 1500 (600 Ca) MG TABS tablet Take 1,500 mg by mouth 2 (two) times daily with a meal.    [provider]  diphenhydrAMINE-APAP, sleep, (TYLENOL PM EXTRA STRENGTH PO) Take by mouth.    [provider]  ipratropium (ATROVENT) 0.03 % nasal spray Place 2 sprays into both nostrils every 12 (twelve) hours.    [provider]  loratadine (CLARITIN) 10 MG tablet Take 10 mg by mouth daily.    [provider]  memantine (NAMENDA) 10 MG tablet Take 1 tablet (10 mg at night) for 2 weeks, then increase to 1 tablet (10 mg) twice a day 09/23/21   Marcos Eke, PA-C  metoprolol succinate (TOPROL-XL) 25 MG 24 hr tablet Take 25 mg by mouth daily.    [provider]  Multiple Vitamins-Minerals (MULTIVITAMIN ADULT PO) Take 0.5 tablets by mouth 2 (two) times daily.    [provider]   Polyethylene Glycol 3350 (MIRALAX PO) Take by mouth.    [provider]  rosuvastatin (CRESTOR) 5 MG tablet Take 5 mg by mouth every other day.    [provider]  VITAMIN D PO Take 400 Units by mouth daily. Take two capsules twice daily    [provider]  Wheat Dextrin (BENEFIBER PO) Take by mouth.    [provider]      Allergies    Gluten meal, Lactose intolerance (gi), and Neosporin [neomycin-bacitracin zn-polymyx]    Review of Systems   Review of Systems  Constitutional:  Negative for fever.  Respiratory:  Negative for shortness of breath.   Cardiovascular:  Positive for palpitations. Negative for chest pain.  Gastrointestinal:  Negative for diarrhea and vomiting.  Neurological:  Negative for dizziness and syncope.    Physical Exam Updated Vital Signs BP (!) 157/64 (BP Location: Right Arm)   Pulse 68   Temp 97.6 F (36.4 C) (Oral)   Resp 18   Ht 1.6 m (5\' 3" )   Wt 60.8 kg   SpO2 100%   BMI 23.74 kg/m  Physical Exam CONSTITUTIONAL: Elderly but appears younger than stated age HEAD: Normocephalic/atraumatic ENMT: Mucous membranes moist NECK: supple no meningeal signs SPINE/BACK:entire spine nontender CV: S1/S2 noted, no murmurs/rubs/gallops noted Frequent PACs noted  LUNGS: Lungs are clear to auscultation bilaterally, no apparent distress ABDOMEN: soft NEURO: Pt is awake/alert/appropriate, moves all extremitiesx4.  No facial droop.   SKIN: warm, color normal PSYCH: no abnormalities of mood noted, alert and oriented to situation  ED Results / Procedures / Treatments   Labs (all labs ordered are listed, but only abnormal results are displayed) Labs Reviewed  BASIC METABOLIC PANEL - Abnormal; Notable for the following components:      Result Value   Glucose, Bld 129 (*)    All other components within normal limits  CBC  MAGNESIUM  TSH  TROPONIN I (HIGH SENSITIVITY)  TROPONIN I (HIGH SENSITIVITY)    EKG EKG  Interpretation Date/Time:  Saturday September 24 2023 04:53:06 EST Ventricular Rate:  74 PR Interval:  168 QRS Duration:  81 QT Interval:  395 QTC Calculation: 439 R Axis:   67  Text Interpretation: Sinus rhythm Left ventricular hypertrophy Anterior Q waves, possibly due to LVH Confirmed by Zadie Rhine ([id number]) on 09/24/2023 4:57:30 AM  Radiology DG Chest 2 View Result Date: 09/23/2023 CLINICAL DATA:  Palpitations EXAM: CHEST - 2 VIEW COMPARISON:  None Available. FINDINGS: Heart size and pulmonary vascularity are normal. Peribronchial thickening with streaky perihilar opacities likely representing acute or chronic bronchitis. No airspace disease or consolidation. No pleural effusions. No pneumothorax. Mediastinal contours appear intact. Calcification of the aorta. Degenerative changes in the spine and shoulders. IMPRESSION: Bronchitic changes in the lungs.  No focal consolidation. Electronically Signed   By: Burman Nieves M.D.   On: 09/23/2023 22:23    Procedures Procedures    Medications Ordered in ED Medications - No data to display  ED Course/ Medical Decision Making/ A&P Clinical Course as of 09/24/23 0457  Sat Sep 24, 2023  0445 Patient sent from outside urgent care for evaluation of PACs.  Patient denies any chest pain or any other significant symptoms.  No syncope.  Patient does have appear to have frequent PACs but no other significant findings.  Patient is already on a beta-blocker per med records.  Will place outpatient referral to cardiology. [DW]    Clinical Course User Index [DW] Zadie Rhine, MD                                 Medical Decision Making Amount and/or Complexity of Data Reviewed ECG/medicine tests: ordered.   This patient presents to the ED for concern of palpitations, this involves an extensive number of treatment options, and is a complaint that carries with it a high risk of complications and morbidity.  The differential diagnosis includes  but is not limited to SVT, ventricular tachycardia, atrial fibrillation, atrial flutter, sinus tachycardia, anxiety  Comorbidities that complicate the patient evaluation: Patient's presentation is complicated by their history of hypertension  Social Determinants of Health: Patient's  poor health literacy   increases the complexity of managing their presentation  Additional history obtained: Additional history obtained from spouse Records reviewed Care Everywhere/External Records  Lab Tests: I Ordered, and personally interpreted labs.  The pertinent results include: Labs overall unremarkable  Imaging Studies ordered: I ordered imaging studies including X-ray chest   I independently visualized and interpreted imaging which showed no acute findings I agree with the radiologist interpretation  Cardiac Monitoring: The patient was maintained on a cardiac monitor.  I personally viewed and interpreted the cardiac monitor which showed an underlying rhythm of:   Frequent PACs   Reevaluation:  After the interventions noted above, I reevaluated the patient and found that they have :stayed the same  Complexity of problems addressed: Patient's presentation is most consistent with  acute presentation with potential threat to life or bodily function  Disposition: After consideration of the diagnostic results and the patient's response to treatment,  I feel that the patent would benefit from discharge   .           Final Clinical Impression(s) / ED Diagnoses Final diagnoses:  Palpitations  PAC (premature atrial contraction)    Rx / DC Orders ED Discharge Orders          Ordered    Ambulatory referral to Cardiology        09/24/23 0441              Zadie Rhine, MD 09/24/23 [phone]

## 2023-10-20 DIAGNOSIS — K080 Exfoliation of teeth due to systemic causes: Secondary | ICD-10-CM | POA: Diagnosis not present

## 2023-11-17 DIAGNOSIS — I10 Essential (primary) hypertension: Secondary | ICD-10-CM | POA: Diagnosis not present

## 2023-11-17 DIAGNOSIS — G309 Alzheimer's disease, unspecified: Secondary | ICD-10-CM | POA: Diagnosis not present

## 2023-11-17 DIAGNOSIS — E785 Hyperlipidemia, unspecified: Secondary | ICD-10-CM | POA: Diagnosis not present

## 2023-11-17 NOTE — Progress Notes (Signed)
  Cardiology Office Note:  .   Date:  11/17/2023  ID:  Brenda Holmes, DOB April 29, 1930, MRN [id number] PCP: Farris Has, MD  [facility] Providers Cardiologist:  None    History of Present Illness: Brenda Holmes   Brenda Holmes is a 89 y.o. female history of left breast cancer 1990, hypertension, who is being referred from the emergency room for reporting palpitations in February as well as hypertension.  Her ECG showed sinus rhythm with PACs, septal Q waves. She has longstanding HTN. She has no known history of heart disease. No prior cardiac stress tests.  She does not have any symptoms. She smoked in the past, she stopped 30-40 years ago She comes in today with her friend.  She is originally from [place] but was living in [place] recently.  ROS:  per HPI otherwise negative   Studies Reviewed: Brenda Holmes        EKG Interpretation Date/Time:  Monday November 21 2023 15:41:32 EDT Ventricular Rate:  76 PR Interval:  208 QRS Duration:  80 QT Interval:  320 QTC Calculation: 360 R Axis:   18  Text Interpretation: Sinus rhythm with Premature atrial complexes in a pattern of bigeminy Non specific ST-T changes When compared with ECG of 24-Sep-2023 04:53, PREVIOUS ECG IS PRESENT Confirmed by Carolan Clines ([id number]) on 11/21/2023 3:46:13 PM  Risk Assessment/Calculations:    Physical Exam:   VS: Vitals:   11/21/23 1527  BP: (!) 174/64  Pulse: 76  SpO2: 98%    Wt Readings from Last 3 Encounters:  09/23/23 134 lb 0.6 oz (60.8 kg)  09/23/21 134 lb (60.8 kg)    GEN: Well nourished, well developed in no acute distress NECK: No JVD CARDIAC: RRR, no murmurs, rubs, gallops RESPIRATORY:  Clear to auscultation without rales, wheezing or rhonchi  ABDOMEN: Soft, non-tender, non-distended EXTREMITIES:  No edema; No deformity   ASSESSMENT AND PLAN: .   PACs This is benign -on BB  HTN -elevated at home. Change amlodipine 5 mg daily to norvasc 10-olmesartan 40 mg daily  BMET in 2  weeks BP log 1 week prior to next visit    Dispo: Follow up in 3 months  Signed, Venie Montesinos, Alben Spittle, MD

## 2023-11-21 ENCOUNTER — Ambulatory Visit: Payer: Medicare Other | Attending: Internal Medicine | Admitting: Internal Medicine

## 2023-11-21 ENCOUNTER — Encounter: Payer: Self-pay | Admitting: Internal Medicine

## 2023-11-21 VITALS — BP 174/64 | HR 76 | Ht 63.0 in | Wt 140.0 lb

## 2023-11-21 DIAGNOSIS — Z79899 Other long term (current) drug therapy: Secondary | ICD-10-CM

## 2023-11-21 DIAGNOSIS — R002 Palpitations: Secondary | ICD-10-CM | POA: Diagnosis not present

## 2023-11-21 MED ORDER — AMLODIPINE-OLMESARTAN 10-40 MG PO TABS: 1.0000 | ORAL_TABLET | Freq: Every day | ORAL | 3 refills | Status: AC

## 2023-11-21 NOTE — Patient Instructions (Addendum)
 Medication Instructions:  START Amlodipine-Olmesartan 10-40 mg 1 tablet daily  STOP Amlodipine(NORVASC) 5 mg daily  *If you need a refill on your cardiac medications before your next appointment, please call your pharmacy*  Lab Work: BMET in 2 weeks  If you have labs (blood work) drawn today and your tests are completely normal, you will receive your results only by: MyChart Message (if you have MyChart) OR A paper copy in the mail If you have any lab test that is abnormal or we need to change your treatment, we will call you to review the results.  Follow-Up: At [facility], you and your health needs are our priority.  As part of our continuing mission to provide you with exceptional heart care, our providers are all part of one team.  This team includes your primary Cardiologist (physician) and Advanced Practice Providers or APPs (Physician Assistants and Nurse Practitioners) who all work together to provide you with the care you need, when you need it.  Your next appointment:   February 16, 2024 at 3:10 pm  Provider:   Edd Fabian NP  Other Instructions Please keep a blood pressure log 1 week prior to f/u and bring with you to appt Blood Pressure Log   Date   Time  Blood Pressure  Position  Example: Nov 1 9 AM 124/78 sitting                                                                                                             1st Floor: - Lobby - Registration  - Pharmacy  - Lab - Cafe  2nd Floor: - PV Lab - Diagnostic Testing (echo, CT, nuclear med)  3rd Floor: - Vacant  4th Floor: - TCTS (cardiothoracic surgery) - AFib Clinic - Structural Heart Clinic - Vascular Surgery  - Vascular Ultrasound  5th Floor: - HeartCare Cardiology (general and EP) - Clinical Pharmacy for coumadin, hypertension, lipid, weight-loss medications, and med management appointments    Valet parking services will be available as well.

## 2023-11-29 ENCOUNTER — Encounter: Payer: Self-pay | Admitting: Physician Assistant

## 2023-11-29 ENCOUNTER — Ambulatory Visit (INDEPENDENT_AMBULATORY_CARE_PROVIDER_SITE_OTHER): Payer: Medicare Other | Admitting: Physician Assistant

## 2023-11-29 VITALS — BP 191/65 | HR 65 | Resp 20 | Ht 63.0 in

## 2023-11-29 DIAGNOSIS — F0280 Dementia in other diseases classified elsewhere without behavioral disturbance: Secondary | ICD-10-CM | POA: Diagnosis not present

## 2023-11-29 DIAGNOSIS — G309 Alzheimer's disease, unspecified: Secondary | ICD-10-CM | POA: Diagnosis not present

## 2023-11-29 MED ORDER — MEMANTINE HCL 10 MG PO TABS: ORAL_TABLET | ORAL | 11 refills | Status: AC

## 2023-11-29 NOTE — Patient Instructions (Signed)
 It was a pleasure to see you today at our office.   Recommendations:  MRI of the brain, the radiology office will call you to arrange you appointment Resume Memantine 10 mg tablets twice a day Follow up in 6 months  RECOMMENDATIONS FOR ALL PATIENTS WITH MEMORY PROBLEMS: 1. Continue to exercise (Recommend 30 minutes of walking everyday, or 3 hours every week) 2. Increase social interactions - continue going to [place] and enjoy social gatherings with friends and family 3. Eat healthy, avoid fried foods and eat more fruits and vegetables 4. Maintain adequate blood pressure, blood sugar, and blood cholesterol level. Reducing the risk of stroke and cardiovascular disease also helps promoting better memory. 5. Avoid stressful situations. Live a simple life and avoid aggravations. Organize your time and prepare for the next day in anticipation. 6. Sleep well, avoid any interruptions of sleep and avoid any distractions in the bedroom that may interfere with adequate sleep quality 7. Avoid sugar, avoid sweets as there is a strong link between excessive sugar intake, diabetes, and cognitive impairment We discussed the Mediterranean diet, which has been shown to help patients reduce the risk of progressive memory disorders and reduces cardiovascular risk. This includes eating fish, eat fruits and green leafy vegetables, nuts like almonds and hazelnuts, walnuts, and also use olive oil. Avoid fast foods and fried foods as much as possible. Avoid sweets and sugar as sugar use has been linked to worsening of memory function.  There is always a concern of gradual progression of memory problems. If this is the case, then we may need to adjust level of care according to patient needs. Support, both to the patient and caregiver, should then be put into place.    FALL PRECAUTIONS: Be cautious when walking. Scan the area for obstacles that may increase the risk of trips and falls. When getting up in the mornings,  sit up at the edge of the bed for a few minutes before getting out of bed. Consider elevating the bed at the head end to avoid drop of blood pressure when getting up. Walk always in a well-lit room (use night lights in the walls). Avoid area rugs or power cords from appliances in the middle of the walkways. Use a walker or a cane if necessary and consider physical therapy for balance exercise. Get your eyesight checked regularly.  FINANCIAL OVERSIGHT: Supervision, especially oversight when making financial decisions or transactions is also recommended.  HOME SAFETY: Consider the safety of the kitchen when operating appliances like stoves, microwave oven, and blender. Consider having supervision and share cooking responsibilities until no longer able to participate in those. Accidents with firearms and other hazards in the house should be identified and addressed as well.   ABILITY TO BE LEFT ALONE: If patient is unable to contact 911 operator, consider using LifeLine, or when the need is there, arrange for someone to stay with patients. Smoking is a fire hazard, consider supervision or cessation. Risk of wandering should be assessed by caregiver and if detected at any point, supervision and safe proof recommendations should be instituted.  MEDICATION SUPERVISION: Inability to self-administer medication needs to be constantly addressed. Implement a mechanism to ensure safe administration of the medications.   DRIVING: Regarding driving, in patients with progressive memory problems, driving will be impaired. We advise to have someone else do the driving if trouble finding directions or if minor accidents are reported. Independent driving assessment is available to determine safety of driving.   If you are  interested in the driving assessment, you can contact the following:  [facility] in [place] [phone]  [facility] [phone]  [facility]  [phone] or [phone]    Mediterranean Diet A Mediterranean diet refers to food and lifestyle choices that are based on the traditions of countries located on the [employer]. This way of eating has been shown to help prevent certain conditions and improve outcomes for people who have chronic diseases, like kidney disease and heart disease. What are tips for following this plan? Lifestyle  Cook and eat meals together with your family, when possible. Drink enough fluid to keep your urine clear or pale yellow. Be physically active every day. This includes: Aerobic exercise like running or swimming. Leisure activities like gardening, walking, or housework. Get 7-8 hours of sleep each night. If recommended by your health care provider, drink red wine in moderation. This means 1 glass a day for nonpregnant women and 2 glasses a day for men. A glass of wine equals 5 oz (150 mL). Reading food labels  Check the serving size of packaged foods. For foods such as rice and pasta, the serving size refers to the amount of cooked product, not dry. Check the total fat in packaged foods. Avoid foods that have saturated fat or trans fats. Check the ingredients list for added sugars, such as corn syrup. Shopping  At the grocery store, buy most of your food from the areas near the walls of the store. This includes: Fresh fruits and vegetables (produce). Grains, beans, nuts, and seeds. Some of these may be available in unpackaged forms or large amounts (in bulk). Fresh seafood. Poultry and eggs. Low-fat dairy products. Buy whole ingredients instead of prepackaged foods. Buy fresh fruits and vegetables in-season from local farmers markets. Buy frozen fruits and vegetables in resealable bags. If you do not have access to quality fresh seafood, buy precooked frozen shrimp or canned fish, such as tuna, salmon, or sardines. Buy small amounts of raw or cooked  vegetables, salads, or olives from the deli or salad bar at your store. Stock your pantry so you always have certain foods on hand, such as olive oil, canned tuna, canned tomatoes, rice, pasta, and beans. Cooking  Cook foods with extra-virgin olive oil instead of using butter or other vegetable oils. Have meat as a side dish, and have vegetables or grains as your main dish. This means having meat in small portions or adding small amounts of meat to foods like pasta or stew. Use beans or vegetables instead of meat in common dishes like chili or lasagna. Experiment with different cooking methods. Try roasting or broiling vegetables instead of steaming or sauteing them. Add frozen vegetables to soups, stews, pasta, or rice. Add nuts or seeds for added healthy fat at each meal. You can add these to yogurt, salads, or vegetable dishes. Marinate fish or vegetables using olive oil, lemon juice, garlic, and fresh herbs. Meal planning  Plan to eat 1 vegetarian meal one day each week. Try to work up to 2 vegetarian meals, if possible. Eat seafood 2 or more times a week. Have healthy snacks readily available, such as: Vegetable sticks with hummus. Greek yogurt. Fruit and nut trail mix. Eat balanced meals throughout the week. This includes: Fruit: 2-3 servings a day Vegetables: 4-5 servings a day Low-fat dairy: 2 servings a day Fish, poultry, or lean meat: 1 serving a day Beans and legumes: 2 or more servings a week  Nuts and seeds: 1-2 servings a day Whole grains: 6-8 servings a day Extra-virgin olive oil: 3-4 servings a day Limit red meat and sweets to only a few servings a month What are my food choices? Mediterranean diet Recommended Grains: Whole-grain pasta. Brown rice. Bulgar wheat. Polenta. Couscous. Whole-wheat bread. Orpah Cobb. Vegetables: Artichokes. Beets. Broccoli. Cabbage. Carrots. Eggplant. Green beans. Chard. Kale. Spinach. Onions. Leeks. Peas. Squash. Tomatoes. Peppers.  Radishes. Fruits: Apples. Apricots. Avocado. Berries. Bananas. Cherries. Dates. Figs. Grapes. Lemons. Melon. Oranges. Peaches. Plums. Pomegranate. Meats and other protein foods: Beans. Almonds. Sunflower seeds. Pine nuts. Peanuts. Cod. Salmon. Scallops. Shrimp. Tuna. Tilapia. Clams. Oysters. Eggs. Dairy: Low-fat milk. Cheese. Greek yogurt. Beverages: Water. Red wine. Herbal tea. Fats and oils: Extra virgin olive oil. Avocado oil. Grape seed oil. Sweets and desserts: [place] yogurt with honey. Baked apples. Poached pears. Trail mix. Seasoning and other foods: Basil. Cilantro. Coriander. Cumin. Mint. Parsley. Sage. Rosemary. Tarragon. Garlic. Oregano. Thyme. Pepper. Balsalmic vinegar. Tahini. Hummus. Tomato sauce. Olives. Mushrooms. Limit these Grains: Prepackaged pasta or rice dishes. Prepackaged cereal with added sugar. Vegetables: Deep fried potatoes (french fries). Fruits: Fruit canned in syrup. Meats and other protein foods: Beef. Pork. Lamb. Poultry with skin. Hot dogs. Tomasa Blase. Dairy: Ice cream. Sour cream. Whole milk. Beverages: Juice. Sugar-sweetened soft drinks. Beer. Liquor and spirits. Fats and oils: Butter. Canola oil. Vegetable oil. Beef fat (tallow). Lard. Sweets and desserts: Cookies. Cakes. Pies. Candy. Seasoning and other foods: Mayonnaise. Premade sauces and marinades. The items listed may not be a complete list. Talk with your dietitian about what dietary choices are right for you. Summary The Mediterranean diet includes both food and lifestyle choices. Eat a variety of fresh fruits and vegetables, beans, nuts, seeds, and whole grains. Limit the amount of red meat and sweets that you eat. Talk with your health care provider about whether it is safe for you to drink red wine in moderation. This means 1 glass a day for nonpregnant women and 2 glasses a day for men. A glass of wine equals 5 oz (150 mL). This information is not intended to replace advice given to you by your health  care provider. Make sure you discuss any questions you have with your health care provider. Document Released: 04/01/2016 Document Revised: 05/04/2016 Document Reviewed: 04/01/2016 Elsevier Interactive Patient Education  2017 [employer].

## 2023-11-29 NOTE — Progress Notes (Signed)
 Assessment/Plan:   Dementia likely due to Alzheimer's disease   Brenda Holmes is a very pleasant 89 y.o. RH female with a history of HTN, HLD, borderline low heart rate, seen today in follow up for memory loss.She was initially seen in 09/2021, then losing to follow up. At the time, her MoCA was 17/30. Memantine had been prescribed, but she was non compliant to the medicine. MRI brain had been ordered but she did not have that performed. Family is concerned that cognitive decline is present. She has little insight into her condition. MMSE today is 18/30. Findings are concerning for dementia due to AD. Discussed resuming the medicine in an effort to slow down her memory loss. They agree to proceed.   Patient is able to participate on ADLs and   to drive very short distances without difficulties   Follow up in 6  months. MRI brain without contrast to further evaluate structural abnormalities and vascular load.  Recommend good control of her cardiovascular risk factors. Patient informed of elevated BP Resume Memantine 10 mg  twice a day.  Side effects discussed  Continue to control mood as per PCP     Subjective:    This patient is accompanied in the office by her husband who supplements the history.  Previous records as well as any outside records available were reviewed prior to todays visit.     Any changes in memory since last visit? "STM is worse", LTM is affected as well. She does not remember coming here or having taken the medicine for a long time. Sometimes she cannot name an objets, describing it instead. Poor insight into her condition repeats oneself?  Endorsed Disoriented when walking into a room?  Patient denies    Leaving objects?  May misplace things  She had to have some papers notarized and lost the papers today.    Wandering behavior?  denies   Any personality changes since last visit?  denies   Any worsening depression?:  Denies.   Hallucinations or paranoia?   Denies.   Seizures? denies    Any sleep changes? Sleeps well . Denies vivid dreams, REM behavior or sleepwalking   Sleep apnea?   Denies.   Any hygiene concerns? Denies.  Independent of bathing and dressing?  Endorsed  Does the patient needs help with medications?  Patient  is in charge, forgets to take them-husband says  Who is in charge of the finances?  She  is in charge     Any changes in appetite?  denies     Patient have trouble swallowing? Denies.   Does the patient cook? No Any headaches?   She has a history of hypertensive headaches Chronic back pain  denies   Ambulates with difficulty? Denies.    Recent falls or head injuries? denies     Unilateral weakness, numbness or tingling? denies   Any tremors?  Denies   Any anosmia?  Denies   Any incontinence of urine?  Endorsed , wears diapers Any bowel dysfunction?   History of  constipation Patient lives with husband  Does the patient drive? Yes, very short distances     Initial visit 09/23/21 The patient is seen in neurologic consultation at the request of Farris Has, MD for the evaluation of memory.  The patient is here alone.  This is a 89 y.o. year old RH  female who has had memory issues for about 5 years, when she began to forget recent conversations or activities.  This has slowly become worse.  She attributes this to age changes and "I am not worried about it, I talk to my friends and will laugh it off ".  She was sent by her PCP, "because I do not see anything wrong with me".  She denies repeating herself or asking the same questions-although during this visit she did very frequently-.  She denies being disoriented.  She denies leaving objects in unusual places.  She continues to drive and wants to drive to [place] in the summer, "nothing is going to stop me, as long as I have the GPS I am okay ".  She lives with her husband who "did not notice anything wrong with me ".  She denies depression or irritability.  She does  crossword puzzles and word finding on a regular basis.  She sleeps well, denies any vivid dreams or sleepwalking, hallucinations or paranoia.  There are no hygiene concerns, she is independent of bathing and dressing.  She has her medications and pillbox, and denies missing any doses.  She still cares about her own finances, denies missing any payments.  Her appetite is good, and drinks plenty water.  Denies trouble swallowing.  She cooks and denies leaving the stove on.  She ambulates with mild difficulty, uses a cane.  Of note, she exercises at [employer], but she could not recall the name of it calling it "the gym ".  She also says that she likes to drive to "the big store "when referring to [facility].  She denies any headaches, double vision, dizziness, focal numbness or tingling, unilateral weakness, tremors, anosmia, or seizures.  Denies significant urine incontinence, retention, occasional constipation and diarrhea.  Denies sleep apnea, alcohol or tobacco.  Family history remarkable for father with Alzheimer's disease.  The patient is a retired Ambulance person in 1979.  She is originally from [place].   PREVIOUS MEDICATIONS:   CURRENT MEDICATIONS:  Outpatient Encounter Medications as of 11/29/2023  Medication Sig   amLODipine-olmesartan (AZOR) 10-40 MG tablet Take 1 tablet by mouth daily.   aspirin EC 81 MG tablet Take 81 mg by mouth daily.   calcium carbonate (OSCAL) 1500 (600 Ca) MG TABS tablet Take 1,500 mg by mouth 2 (two) times daily with a meal.   diphenhydrAMINE-APAP, sleep, (TYLENOL PM EXTRA STRENGTH PO) Take by mouth.   ipratropium (ATROVENT) 0.03 % nasal spray Place 2 sprays into both nostrils every 12 (twelve) hours.   loratadine (CLARITIN) 10 MG tablet Take 10 mg by mouth daily.   metoprolol succinate (TOPROL-XL) 25 MG 24 hr tablet Take 25 mg by mouth daily.   Multiple Vitamins-Minerals (MULTIVITAMIN ADULT PO) Take 0.5 tablets by mouth 2 (two) times daily.   Polyethylene  Glycol 3350 (MIRALAX PO) Take by mouth.   rosuvastatin (CRESTOR) 5 MG tablet Take 5 mg by mouth every other day.   VITAMIN D PO Take 400 Units by mouth daily. Take two capsules twice daily   Wheat Dextrin (BENEFIBER PO) Take by mouth.   [DISCONTINUED] memantine (NAMENDA) 10 MG tablet Take 1 tablet (10 mg at night) for 2 weeks, then increase to 1 tablet (10 mg) twice a day   memantine (NAMENDA) 10 MG tablet Take 1 tablet (10 mg) twice a day   No facility-administered encounter medications on file as of 11/29/2023.       11/29/2023    5:00 PM  MMSE - Mini Mental State Exam  Orientation to time 2  Orientation to Place 2  Registration 3  Attention/ Calculation 3  Recall 0  Language- name 2 objects 2  Language- repeat 1  Language- follow 3 step command 3  Language- read & follow direction 1  Write a sentence 1  Copy design 0  Total score 18      09/23/2021    1:00 PM  Montreal Cognitive Assessment   Visuospatial/ Executive (0/5) 1  Naming (0/3) 1  Attention: Read list of digits (0/2) 2  Attention: Read list of letters (0/1) 1  Attention: Serial 7 subtraction starting at 100 (0/3) 0  Language: Repeat phrase (0/2) 2  Language : Fluency (0/1) 1  Abstraction (0/2) 2  Delayed Recall (0/5) 1  Orientation (0/6) 6  Total 17  Adjusted Score (based on education) 17    Objective:     PHYSICAL EXAMINATION:    VITALS:   Vitals:   11/29/23 1528 11/29/23 1617  BP: (!) 184/75 (!) 191/65  Pulse: 65   Resp: 20   SpO2: 98%   Height: 5\' 3"  (1.6 m)     GEN:  The patient appears stated age and is in NAD. HEENT:  Normocephalic, atraumatic.   Neurological examination:  General: NAD, well-groomed, appears stated age. Orientation: The patient is alert. Oriented to person, not to place and date Cranial nerves: There is good facial symmetry.The speech is fluent and clear. No aphasia or dysarthria. Fund of knowledge is reduced. Recent and remote memory are impaired. Attention and  concentration are reduced.  Able to name objects and unable to repeat phrases.  Hearing is intact to conversational tone.   Sensation: Sensation is intact to light touch throughout Motor: Strength is at least antigravity x4. DTR's 2/4 in UE/LE     Movement examination: Tone: There is normal tone in the UE/LE Abnormal movements:  no tremor.  No myoclonus.  No asterixis.   Coordination:  There is no decremation with RAM's. Normal finger to nose  Gait and Station: The patient has no difficulty arising out of a deep-seated chair without the use of the hands. The patient's stride length is good. Needs a cane for stability Gait is cautious and narrow.    Thank you for allowing [place] the opportunity to participate in the care of this nice patient. Please do not hesitate to contact us for any questions or concerns.   Total time spent on today's visit was 22 minutes dedicated to this patient today, preparing to see patient, examining the patient, ordering tests and/or medications and counseling the patient, documenting clinical information in the EHR or other health record, independently interpreting results and communicating results to the patient/family, discussing treatment and goals, answering patient's questions and coordinating care.  Cc:  Farris Has, MD  Brenda Holmes 11/29/2023 5:11 PM

## 2023-12-02 DIAGNOSIS — J300 Vasomotor rhinitis: Secondary | ICD-10-CM | POA: Diagnosis not present

## 2023-12-02 DIAGNOSIS — J310 Chronic rhinitis: Secondary | ICD-10-CM | POA: Diagnosis not present

## 2023-12-02 DIAGNOSIS — R040 Epistaxis: Secondary | ICD-10-CM | POA: Diagnosis not present

## 2023-12-02 DIAGNOSIS — H1045 Other chronic allergic conjunctivitis: Secondary | ICD-10-CM | POA: Diagnosis not present

## 2023-12-13 ENCOUNTER — Encounter: Payer: Self-pay | Admitting: Physician Assistant

## 2023-12-20 ENCOUNTER — Ambulatory Visit
Admission: RE | Admit: 2023-12-20 | Discharge: 2023-12-20 | Disposition: A | Discharge status: Home / Self Care | Source: Ambulatory Visit | Attending: Physician Assistant | Admitting: Physician Assistant

## 2024-01-03 ENCOUNTER — Ambulatory Visit: Payer: Self-pay | Admitting: Physician Assistant

## 2024-01-04 NOTE — Telephone Encounter (Signed)
 Pt's son called in wanting to get results. I let him know he is not on the DPR. He said to call the pt's husband Sammie Crigler to give results

## 2024-02-03 ENCOUNTER — Telehealth: Payer: Self-pay | Admitting: Physician Assistant

## 2024-02-03 NOTE — Telephone Encounter (Signed)
 Pt.s daughter would like to join appt via Phone.please contact back on response

## 2024-02-06 NOTE — Progress Notes (Signed)
 Assessment/Plan:   Dementia likely due to Alzheimer's disease  Brenda Holmes is a very pleasant 89 y.o. RH female with a history ofHTN, HLD, borderline low heart rate seen today in follow up for memory loss. Patient is currently on memantine  10 mg twice daily.  Stable WBC.  Her ADLs and to drive very short distances without difficulties.***.      Follow up in   months. Continue memantine  10 mg twice daily, side effects discussed*** Recommend good control of her cardiovascular risk factors Continue to control mood as per PCP     Subjective:    This patient is accompanied in the office by her husband*** who supplements the history.  Previous records as well as any outside records available were reviewed prior to todays visit. Patient was last seen on 11/29/2023, with MMSE 18/30.***   Any changes in memory since last visit?  repeats oneself?  Endorsed Disoriented when walking into a room? Denies ***  Leaving objects?  May misplace things but not in unusual places***  Wandering behavior?  denies   Any personality changes since last visit?  Denies.   Any worsening depression?:  Denies.   Hallucinations or paranoia?  Denies.   Seizures? denies    Any sleep changes?  Denies vivid dreams, REM behavior or sleepwalking   Sleep apnea?   Denies.   Any hygiene concerns? Denies.  Independent of bathing and dressing?  Endorsed  Does the patient needs help with medications?   is in charge *** Who is in charge of the finances?   is in charge   *** Any changes in appetite?  denies ***   Patient have trouble swallowing? Denies.   Does the patient cook? No Any headaches?   denies   Any vision changes?*** Chronic back pain  denies   Ambulates with difficulty? Denies.  *** Recent falls or head injuries? Denies.     Unilateral weakness, numbness or tingling? denies   Any tremors?  Denies  *** Any anosmia?  Denies   Any incontinence of urine?  Endorsed***  Any bowel dysfunction?    Denies      Patient lives   *** Does the patient drive? No longer drives ***   PREVIOUS MEDICATIONS:   CURRENT MEDICATIONS:  Outpatient Encounter Medications as of 02/07/2024  Medication Sig   amLODipine -olmesartan  (AZOR ) 10-40 MG tablet Take 1 tablet by mouth daily.   aspirin EC 81 MG tablet Take 81 mg by mouth daily.   calcium carbonate (OSCAL) 1500 (600 Ca) MG TABS tablet Take 1,500 mg by mouth 2 (two) times daily with a meal.   diphenhydrAMINE-APAP, sleep, (TYLENOL  PM EXTRA STRENGTH PO) Take by mouth.   ipratropium (ATROVENT) 0.03 % nasal spray Place 2 sprays into both nostrils every 12 (twelve) hours.   loratadine (CLARITIN) 10 MG tablet Take 10 mg by mouth daily.   memantine  (NAMENDA ) 10 MG tablet Take 1 tablet (10 mg) twice a day   metoprolol succinate (TOPROL-XL) 25 MG 24 hr tablet Take 25 mg by mouth daily.   Multiple Vitamins-Minerals (MULTIVITAMIN ADULT PO) Take 0.5 tablets by mouth 2 (two) times daily.   Polyethylene Glycol 3350 (MIRALAX PO) Take by mouth.   rosuvastatin (CRESTOR) 5 MG tablet Take 5 mg by mouth every other day.   VITAMIN D PO Take 400 Units by mouth daily. Take two capsules twice daily   Wheat Dextrin (BENEFIBER PO) Take by mouth.   No facility-administered encounter medications on file as of 02/07/2024.  11/29/2023    5:00 PM  MMSE - Mini Mental State Exam  Orientation to time 2  Orientation to Place 2  Registration 3  Attention/ Calculation 3  Recall 0  Language- name 2 objects 2  Language- repeat 1  Language- follow 3 step command 3  Language- read & follow direction 1  Write a sentence 1  Copy design 0  Total score 18      09/23/2021    1:00 PM  Montreal Cognitive Assessment   Visuospatial/ Executive (0/5) 1  Naming (0/3) 1  Attention: Read list of digits (0/2) 2  Attention: Read list of letters (0/1) 1  Attention: Serial 7 subtraction starting at 100 (0/3) 0  Language: Repeat phrase (0/2) 2  Language : Fluency (0/1) 1   Abstraction (0/2) 2  Delayed Recall (0/5) 1  Orientation (0/6) 6  Total 17  Adjusted Score (based on education) 17    Objective:     PHYSICAL EXAMINATION:    VITALS:  There were no vitals filed for this visit.  GEN:  The patient appears stated age and is in NAD. HEENT:  Normocephalic, atraumatic.   Neurological examination:  General: NAD, well-groomed, appears stated age. Orientation: The patient is alert. Oriented to person, place and date Cranial nerves: There is good facial symmetry.The speech is fluent and clear. No aphasia or dysarthria. Fund of knowledge is appropriate. Recent and remote memory are impaired. Attention and concentration are reduced. Able to name objects and repeat phrases.  Hearing is intact to conversational tone. *** Sensation: Sensation is intact to light touch throughout Motor: Strength is at least antigravity x4. DTR's 2/4 in UE/LE     Movement examination: Tone: There is normal tone in the UE/LE Abnormal movements:  no tremor.  No myoclonus.  No asterixis.   Coordination:  There is no decremation with RAM's. Normal finger to nose  Gait and Station: The patient has no*** difficulty arising out of a deep-seated chair without the use of the hands. The patient's stride length is good.  Gait is cautious and narrow.    Thank you for allowing us  the opportunity to participate in the care of this nice patient. Please do not hesitate to contact us  for any questions or concerns.   Total time spent on today's visit was *** minutes dedicated to this patient today, preparing to see patient, examining the patient, ordering tests and/or medications and counseling the patient, documenting clinical information in the EHR or other health record, independently interpreting results and communicating results to the patient/family, discussing treatment and goals, answering patient's questions and coordinating care.  Cc:  Ronna Coho, MD  Tex Filbert 02/06/2024 4:57  PM

## 2024-02-07 ENCOUNTER — Ambulatory Visit: Admitting: Physician Assistant

## 2024-02-07 ENCOUNTER — Encounter: Payer: Self-pay | Admitting: Physician Assistant

## 2024-02-07 VITALS — BP 140/80 | HR 74 | Resp 20 | Ht 63.0 in

## 2024-02-07 DIAGNOSIS — G309 Alzheimer's disease, unspecified: Secondary | ICD-10-CM

## 2024-02-07 DIAGNOSIS — F0280 Dementia in other diseases classified elsewhere without behavioral disturbance: Secondary | ICD-10-CM

## 2024-02-07 NOTE — Patient Instructions (Signed)
 It was a pleasure to see you today at our office.   Recommendations:   COntinue Memantine  10 mg tablets twice a day Follow up in 6 months  RECOMMENDATIONS FOR ALL PATIENTS WITH MEMORY PROBLEMS: 1. Continue to exercise (Recommend 30 minutes of walking everyday, or 3 hours every week) 2. Increase social interactions - continue going to Brenda Holmes and enjoy social gatherings with friends and family 3. Eat healthy, avoid fried foods and eat more fruits and vegetables 4. Maintain adequate blood pressure, blood sugar, and blood cholesterol level. Reducing the risk of stroke and cardiovascular disease also helps promoting better memory. 5. Avoid stressful situations. Live a simple life and avoid aggravations. Organize your time and prepare for the next day in anticipation. 6. Sleep well, avoid any interruptions of sleep and avoid any distractions in the bedroom that may interfere with adequate sleep quality 7. Avoid sugar, avoid sweets as there is a strong link between excessive sugar intake, diabetes, and cognitive impairment We discussed the Mediterranean diet, which has been shown to help patients reduce the risk of progressive memory disorders and reduces cardiovascular risk. This includes eating fish, eat fruits and green leafy vegetables, nuts like almonds and hazelnuts, walnuts, and also use olive oil. Avoid fast foods and fried foods as much as possible. Avoid sweets and sugar as sugar use has been linked to worsening of memory function.  There is always a concern of gradual progression of memory problems. If this is the case, then we may need to adjust level of care according to patient needs. Support, both to the patient and caregiver, should then be put into place.    FALL PRECAUTIONS: Be cautious when walking. Scan the area for obstacles that may increase the risk of trips and falls. When getting up in the mornings, sit up at the edge of the bed for a few minutes before getting out of bed.  Consider elevating the bed at the head end to avoid drop of blood pressure when getting up. Walk always in a well-lit room (use night lights in the walls). Avoid area rugs or power cords from appliances in the middle of the walkways. Use a walker or a cane if necessary and consider physical therapy for balance exercise. Get your eyesight checked regularly.  FINANCIAL OVERSIGHT: Supervision, especially oversight when making financial decisions or transactions is also recommended.  HOME SAFETY: Consider the safety of the kitchen when operating appliances like stoves, microwave oven, and blender. Consider having supervision and share cooking responsibilities until no longer able to participate in those. Accidents with firearms and other hazards in the house should be identified and addressed as well.   ABILITY TO BE LEFT ALONE: If patient is unable to contact 911 operator, consider using LifeLine, or when the need is there, arrange for someone to stay with patients. Smoking is a fire hazard, consider supervision or cessation. Risk of wandering should be assessed by caregiver and if detected at any point, supervision and safe proof recommendations should be instituted.  MEDICATION SUPERVISION: Inability to self-administer medication needs to be constantly addressed. Implement a mechanism to ensure safe administration of the medications.   DRIVING: Regarding driving, in patients with progressive memory problems, driving will be impaired. We advise to have someone else do the driving if trouble finding directions or if minor accidents are reported. Independent driving assessment is available to determine safety of driving.   If you are interested in the driving assessment, you can contact the following:  [employer] [facility] [place] [phone]  [facility] [phone]  [facility] [phone] or  [phone]    Mediterranean Diet A Mediterranean diet refers to food and lifestyle choices that are based on the traditions of countries located on the [employer]. This way of eating has been shown to help prevent certain conditions and improve outcomes for people who have chronic diseases, like kidney disease and heart disease. What are tips for following this plan? Lifestyle  Cook and eat meals together with your family, when possible. Drink enough fluid to keep your urine clear or pale yellow. Be physically active every day. This includes: Aerobic exercise like running or swimming. Leisure activities like gardening, walking, or housework. Get 7-8 hours of sleep each night. If recommended by your health care provider, drink red wine in moderation. This means 1 glass a day for nonpregnant women and 2 glasses a day for men. A glass of wine equals 5 oz (150 mL). Reading food labels  Check the serving size of packaged foods. For foods such as rice and pasta, the serving size refers to the amount of cooked product, not dry. Check the total fat in packaged foods. Avoid foods that have saturated fat or trans fats. Check the ingredients list for added sugars, such as corn syrup. Shopping  At the grocery store, buy most of your food from the areas near the walls of the store. This includes: Fresh fruits and vegetables (produce). Grains, beans, nuts, and seeds. Some of these may be available in unpackaged forms or large amounts (in bulk). Fresh seafood. Poultry and eggs. Low-fat dairy products. Buy whole ingredients instead of prepackaged foods. Buy fresh fruits and vegetables in-season from local farmers markets. Buy frozen fruits and vegetables in resealable bags. If you do not have access to quality fresh seafood, buy precooked frozen shrimp or canned fish, such as tuna, salmon, or sardines. Buy small amounts of raw or cooked vegetables, salads, or olives from the deli or salad bar  at your store. Stock your pantry so you always have certain foods on hand, such as olive oil, canned tuna, canned tomatoes, rice, pasta, and beans. Cooking  Cook foods with extra-virgin olive oil instead of using butter or other vegetable oils. Have meat as a side dish, and have vegetables or grains as your main dish. This means having meat in small portions or adding small amounts of meat to foods like pasta or stew. Use beans or vegetables instead of meat in common dishes like chili or lasagna. Experiment with different cooking methods. Try roasting or broiling vegetables instead of steaming or sauteing them. Add frozen vegetables to soups, stews, pasta, or rice. Add nuts or seeds for added healthy fat at each meal. You can add these to yogurt, salads, or vegetable dishes. Marinate fish or vegetables using olive oil, lemon juice, garlic, and fresh herbs. Meal planning  Plan to eat 1 vegetarian meal one day each week. Try to work up to 2 vegetarian meals, if possible. Eat seafood 2 or more times a week. Have healthy snacks readily available, such as: Vegetable sticks with hummus. Greek yogurt. Fruit and nut trail mix. Eat balanced meals throughout the week. This includes: Fruit: 2-3 servings a day Vegetables: 4-5 servings a day Low-fat dairy: 2 servings a day Fish, poultry, or lean meat: 1 serving a day Beans and legumes: 2 or more servings a week Nuts and seeds: 1-2 servings a day Whole grains: 6-8 servings a day  Extra-virgin olive oil: 3-4 servings a day Limit red meat and sweets to only a few servings a month What are my food choices? Mediterranean diet Recommended Grains: Whole-grain pasta. Brown rice. Bulgar wheat. Polenta. Couscous. Whole-wheat bread. Dwyane Glad. Vegetables: Artichokes. Beets. Broccoli. Cabbage. Carrots. Eggplant. Green beans. Chard. Kale. Spinach. Onions. Leeks. Peas. Squash. Tomatoes. Peppers. Radishes. Fruits: Apples. Apricots. Avocado. Berries.  Bananas. Cherries. Dates. Figs. Grapes. Lemons. Melon. Oranges. Peaches. Plums. Pomegranate. Meats and other protein foods: Beans. Almonds. Sunflower seeds. Pine nuts. Peanuts. Cod. Salmon. Scallops. Shrimp. Tuna. Tilapia. Clams. Oysters. Eggs. Dairy: Low-fat milk. Cheese. Greek yogurt. Beverages: Water. Red wine. Herbal tea. Fats and oils: Extra virgin olive oil. Avocado oil. Grape seed oil. Sweets and desserts: [place] yogurt with honey. Baked apples. Poached pears. Trail mix. Seasoning and other foods: Basil. Cilantro. Coriander. Cumin. Mint. Parsley. Sage. Rosemary. Tarragon. Garlic. Oregano. Thyme. Pepper. Balsalmic vinegar. Tahini. Hummus. Tomato sauce. Olives. Mushrooms. Limit these Grains: Prepackaged pasta or rice dishes. Prepackaged cereal with added sugar. Vegetables: Deep fried potatoes (french fries). Fruits: Fruit canned in syrup. Meats and other protein foods: Beef. Pork. Lamb. Poultry with skin. Hot dogs. Helene Loader. Dairy: Ice cream. Sour cream. Whole milk. Beverages: Juice. Sugar-sweetened soft drinks. Beer. Liquor and spirits. Fats and oils: Butter. Canola oil. Vegetable oil. Beef fat (tallow). Lard. Sweets and desserts: Cookies. Cakes. Pies. Candy. Seasoning and other foods: Mayonnaise. Premade sauces and marinades. The items listed may not be a complete list. Talk with your dietitian about what dietary choices are right for you. Summary The Mediterranean diet includes both food and lifestyle choices. Eat a variety of fresh fruits and vegetables, beans, nuts, seeds, and whole grains. Limit the amount of red meat and sweets that you eat. Talk with your health care provider about whether it is safe for you to drink red wine in moderation. This means 1 glass a day for nonpregnant women and 2 glasses a day for men. A glass of wine equals 5 oz (150 mL). This information is not intended to replace advice given to you by your health care provider. Make sure you discuss any questions you  have with your health care provider. Document Released: 04/01/2016 Document Revised: 05/04/2016 Document Reviewed: 04/01/2016 Elsevier Interactive Patient Education  2017 [employer].

## 2024-02-09 DIAGNOSIS — Z87891 Personal history of nicotine dependence: Secondary | ICD-10-CM | POA: Diagnosis not present

## 2024-02-09 DIAGNOSIS — G309 Alzheimer's disease, unspecified: Secondary | ICD-10-CM | POA: Diagnosis not present

## 2024-02-14 ENCOUNTER — Other Ambulatory Visit: Payer: Self-pay | Admitting: Family Medicine

## 2024-02-14 ENCOUNTER — Encounter: Payer: Self-pay | Admitting: Family Medicine

## 2024-02-14 ENCOUNTER — Encounter

## 2024-02-14 ENCOUNTER — Ambulatory Visit
Admission: RE | Admit: 2024-02-14 | Discharge: 2024-02-14 | Disposition: A | Discharge status: Home / Self Care | Source: Ambulatory Visit | Attending: Family Medicine | Admitting: Family Medicine

## 2024-02-14 DIAGNOSIS — N6489 Other specified disorders of breast: Secondary | ICD-10-CM

## 2024-02-15 NOTE — Progress Notes (Signed)
  Cardiology Office Note:  .   Date:  02/16/2024  ID:  Brenda Holmes, DOB 09-13-1929, MRN [id number] PCP: Kip Righter, MD  [facility] Providers Cardiologist:  Dorn Lesches, MD  NEW- requested by patient's husband History of Present Illness: Brenda Holmes is a 89 y.o. female  with PMHx of HTN, HLD,  Palpitations, previous tobacco use, left breast cancer 1992, Alzheimer's/dementia (followed by neurology) who reports to [facility] office for follow up.   Last seen in heartcare OV 10/2023 by Dr. Ronal Ross for new patient referral from the ED for palpitations and HTN.  Reported longstanding HTN, denied any known heart disease, and no prior cardiac stress test. EKG showed NSR with PACs, HR 76, septal Q waves. BP in office elevated 174/64. Changed amlodipine  5 mg daily to Norvasc-Olmesartan  10-40 mg daily.  Follow-up BMP ordered but never completed.   Denied any cardiac symptoms including chest pain, shortness of breath, palpitations, syncope, presyncope, dizziness, orthopnea, PND, swelling or significant weight changes, acute bleeding, or claudication.  Today's BP: 178/76 ? repeat 142/60. Home BP log: ranges from 130-160/50-60. Reports compliance with medications.  Endorses healthy low sodium diet. Not very active, mainly stays at home around the house. Husband planning to get her more active and exercise. Denies tobacco use/Binging ETOH/drug use. Denies any hospitalizations or visits to the emergency department.   ROS: 10 point review of system has been reviewed and considered negative except ones been listed in the HPI.   Studies Reviewed: .   Reviewed previous EKG 11/21/2023: EKG showed NSR with PACs, HR 76, septal Q waves  Physical Exam:   VS:  BP (!) 142/60 (BP Location: Right Arm, Patient Position: Sitting, Cuff Size: Normal)   Pulse 62   Ht 5' 2 (1.575 m)   Wt 140 lb 12.8 oz (63.9 kg)   SpO2 98%   BMI 25.75 kg/m    Wt Readings from Last 3 Encounters:  02/16/24  140 lb 12.8 oz (63.9 kg)  11/21/23 140 lb (63.5 kg)  09/23/23 134 lb 0.6 oz (60.8 kg)    GEN: Well nourished, well developed in no acute distress while sitting in chair. Accompanied by husband  NECK: No JVD; No carotid bruits CARDIAC: RRR, no murmurs, rubs, gallops RESPIRATORY:  Clear to auscultation without rales, wheezing or rhonchi  ABDOMEN: Soft, non-tender, non-distended EXTREMITIES:  No edema; No deformity   ASSESSMENT AND PLAN: .   HTN Last visit: elevated BP, switch from amlodipine  to olmesartan -amlodipine  combination. Follow-up BMP was ordered but not completed. Today's BP: 178/76 ? repeat 142/60. Home BP log: ranges from 130-160/50-60. Reviewed proper BP monitoring technique. Patient unsure if daily tea contains caffeine--will begin checking BP before drinking. Husband prefers in-office follow-up over submitting BP logs. Declined metoprolol dose increase at this time, preferring to monitor BP for now. Most recent labs (08/2023): K 3.5, Cr 0.88 ? recommend repeating BMP. Order BMP.  Continue olmesartan -amlodipine  40/10 mg daily and metoprolol succinate (Toprol XL) 25 mg daily. Encouraged lifestyle modifications.   Palpitations Denies palpitations  08/2023: TSH/K/Mg/CBC/TN WNL  Continue Toprol as above If symptoms return then consider Zio for further evaluation.   HLD 02/2023 LDL  83 Continue on Crestor 5 mg every other day  Previous Tobacco use Stopped smoking 30 to 40 years ago  Alzheimer's/dementia  Followed by neurology, last seen 02/07/2024    Dispo: Follow up 02/29/2024 with me at 2:45 pm  Signed, Lorette CINDERELLA Kapur, PA-C

## 2024-02-16 ENCOUNTER — Encounter: Payer: Self-pay | Admitting: General Practice

## 2024-02-16 ENCOUNTER — Ambulatory Visit: Attending: General Practice | Admitting: Physician Assistant

## 2024-02-16 VITALS — BP 142/60 | HR 62 | Ht 62.0 in | Wt 140.8 lb

## 2024-02-16 DIAGNOSIS — R002 Palpitations: Secondary | ICD-10-CM

## 2024-02-16 DIAGNOSIS — Z87891 Personal history of nicotine dependence: Secondary | ICD-10-CM | POA: Diagnosis not present

## 2024-02-16 DIAGNOSIS — I10 Essential (primary) hypertension: Secondary | ICD-10-CM

## 2024-02-16 DIAGNOSIS — E785 Hyperlipidemia, unspecified: Secondary | ICD-10-CM | POA: Diagnosis not present

## 2024-02-16 DIAGNOSIS — Z79899 Other long term (current) drug therapy: Secondary | ICD-10-CM | POA: Diagnosis not present

## 2024-02-16 DIAGNOSIS — G309 Alzheimer's disease, unspecified: Secondary | ICD-10-CM

## 2024-02-16 DIAGNOSIS — F0280 Dementia in other diseases classified elsewhere without behavioral disturbance: Secondary | ICD-10-CM

## 2024-02-16 NOTE — Patient Instructions (Addendum)
 Medication Instructions:   Your physician recommends that you continue on your current medications as directed. Please refer to the Current Medication list given to you today.   *If you need a refill on your cardiac medications before your next appointment, please call your pharmacy*    Lab Work:  PLEASE GO DOWN STAIRS  [facility]  FIRST FLOOR   ( GET OFF ELEVATORS WALK TOWARDS WAITING AREA LAB LOCATED BY PHARMACY):  BMET TODAY      If you have labs (blood work) drawn today and your tests are completely normal, you will receive your results only by: MyChart Message (if you have MyChart) OR A paper copy in the mail If you have any lab test that is abnormal or we need to change your treatment, we will call you to review the results.    Testing/Procedures: NONE ORDERED  TODAY    Follow-Up: At [facility], you and your health needs are our priority.  As part of our continuing mission to provide you with exceptional heart care, our providers are all part of one team.  This team includes your primary Cardiologist (physician) and Advanced Practice Providers or APPs (Physician Assistants and Nurse Practitioners) who all work together to provide you with the care you need, when you need it.   Your next appointment:  AS SCHEDULED WITH DUNLAP ON 02-29-24     We recommend signing up for the patient portal called MyChart.  Sign up information is provided on this After Visit Summary.  MyChart is used to connect with patients for Virtual Visits (Telemedicine).  Patients are able to view lab/test results, encounter notes, upcoming appointments, etc.  Non-urgent messages can be sent to your provider as well.   To learn more about what you can do with MyChart, go to [email or web address].   Other Instructions

## 2024-02-17 LAB — BASIC METABOLIC PANEL WITH GFR
BUN/Creatinine Ratio: 27 (ref 12–28)
BUN: 33 mg/dL (ref 10–36)
CO2: 22 mmol/L (ref 20–29)
Calcium: 10.8 mg/dL — ABNORMAL HIGH (ref 8.7–10.3)
Chloride: 99 mmol/L (ref 96–106)
Creatinine, Ser: 1.21 mg/dL — ABNORMAL HIGH (ref 0.57–1.00)
Glucose: 94 mg/dL (ref 70–99)
Potassium: 4.1 mmol/L (ref 3.5–5.2)
Sodium: 139 mmol/L (ref 134–144)
eGFR: 42 mL/min/{1.73_m2} — ABNORMAL LOW (ref >59)

## 2024-02-20 ENCOUNTER — Ambulatory Visit: Payer: Self-pay | Admitting: Physician Assistant

## 2024-02-27 NOTE — Progress Notes (Unsigned)
 Cardiology Clinic Note   Patient Name: Brenda Holmes Date of Encounter: 02/29/2024  Primary Care Provider:  Kip Righter, MD Primary Cardiologist:  Dorn Lesches, MD  Patient Profile    Brenda Holmes 89 year old female presents to the clinic today for follow-up evaluation of her hypertension and palpitations.  Past Medical History    Past Medical History:  Diagnosis Date   Cancer (HCC)    Left breast cancer 1990   Hyperlipidemia    Hypertension    Personal history of chemotherapy    Past Surgical History:  Procedure Laterality Date   ABDOMINAL HYSTERECTOMY     CHOLECYSTECTOMY     MASTECTOMY Left 1990    Allergies  Allergies  Allergen Reactions   Gluten Meal Nausea And Vomiting   Lactose Intolerance (Gi) Nausea And Vomiting   Neosporin [Neomycin-Bacitracin Zn-Polymyx] Swelling   Hydrocortisone Rash    History of Present Illness    Brenda Holmes is a PMH of hypertension, hyperlipidemia, palpitations, previous tobacco use, breast cancer 1992, and Alzheimer/dementia (following with neurology).  She was seen and evaluated by Dr. Alvan on 3/25.  She was referred from the emergency department for evaluation of her palpitations and hypertension.  She reported a long history of hypertension.  She denied heart disease and previous MI.  Her EKG showed sinus rhythm with PACs.  Her blood pressure was noted to be 174/64.  Her amlodipine  was changed to Norvasc-olmesartan  10-40 mg daily.  She was seen in follow-up on 02/16/2024 by Sheron RIGGERS.  She denied chest pain and shortness of breath.  She denied palpitations and syncope.  She denied PND orthopnea and lower extremity swelling.  Her blood pressure was initially 178/76 and on repeat was 142/60.  Her home blood pressure log showed blood pressures in the 130-160 range over 50-60.  She reported medication compliance.  She had been eating a low-sodium diet.  She was not very active and mainly did activity around the  house.  She was planning to be more physically active with her husband.  She denied tobacco, alcohol, and drug use.  Lifestyle modification was encouraged.  She presents to the clinic today for follow-up evaluation states she feels well.  She has been monitoring her blood pressure at home and notes occasional episodes of elevated blood pressure and a range from 150-127 systolic.  We reviewed secondary causes of hypertension.  She notes that she does have occasional headaches but these are possibly 2-3 times per month.  She takes Tylenol  for this.  She has been eating a low-sodium diet.  She presents with her husband.  He expresses frustration that they have seen multiple different cardiology providers.  I offered to have his wife see myself or Dr. Wadie on a regular basis annually and as needed.  He reported that he would send a letter to the medical review board.  Her blood pressure in clinic today is 138/60.  I will continue her current medication regimen, repeat BMP, give mindfulness stress reduction, and salty 6 diet sheet.  We will plan follow-up in 6 months.  Today she denies chest pain, shortness of breath, lower extremity edema, fatigue, palpitations, melena, hematuria, hemoptysis, diaphoresis, weakness, presyncope, syncope, orthopnea, and PND.    Home Medications    Prior to Admission medications   Medication Sig Start Date End Date Taking? Authorizing Provider  amLODipine -olmesartan  (AZOR ) 10-40 MG tablet Take 1 tablet by mouth daily. 11/21/23   Alvan Ronal BRAVO, MD  aspirin EC 81  MG tablet Take 81 mg by mouth daily.    [provider]  calcium carbonate (OSCAL) 1500 (600 Ca) MG TABS tablet Take 1,500 mg by mouth 2 (two) times daily with a meal.    [provider]  diphenhydrAMINE-APAP, sleep, (TYLENOL  PM EXTRA STRENGTH PO) Take by mouth.    [provider]  ipratropium (ATROVENT) 0.03 % nasal spray Place 2 sprays into both nostrils every 12 (twelve) hours.     [provider]  loratadine (CLARITIN) 10 MG tablet Take 10 mg by mouth daily.    [provider]  memantine  (NAMENDA ) 10 MG tablet Take 1 tablet (10 mg) twice a day 11/29/23   Wertman, Sara E, PA-C  metoprolol succinate (TOPROL-XL) 25 MG 24 hr tablet Take 25 mg by mouth daily.    [provider]  Multiple Vitamins-Minerals (MULTIVITAMIN ADULT PO) Take 0.5 tablets by mouth 2 (two) times daily.    [provider]  Polyethylene Glycol 3350 (MIRALAX PO) Take by mouth.    [provider]  rosuvastatin (CRESTOR) 5 MG tablet Take 5 mg by mouth every other day.    [provider]  VITAMIN D PO Take 400 Units by mouth daily. Take two capsules twice daily    [provider]  Wheat Dextrin (BENEFIBER PO) Take by mouth.    [provider]    Family History    Family History  Problem Relation Age of Onset   Hypertension Mother    Diabetes Mother    Hypertension Father    She indicated that her mother is deceased. She indicated that her father is deceased.  Social History    Social History   Socioeconomic History   Marital status: Unknown    Spouse name: Not on file   Number of children: Not on file   Years of education: Not on file   Highest education level: Not on file  Occupational History   Not on file  Tobacco Use   Smoking status: Never   Smokeless tobacco: Former  Substance and Sexual Activity   Alcohol use: Yes    Comment: Occasional glass of wine   Drug use: No   Sexual activity: Not on file  Other Topics Concern   Not on file  Social History Narrative   Right Handed    Lives on the second floor. 15 stairs to front door. No elevators    Social Drivers of Corporate investment banker Strain: Not on file  Food Insecurity: Not on file  Transportation Needs: Not on file  Physical Activity: Not on file  Stress: Not on file  Social Connections: Not on file  Intimate Partner Violence: Not on file      Review of Systems    General:  No chills, fever, night sweats or weight changes.  Cardiovascular:  No chest pain, dyspnea on exertion, edema, orthopnea, palpitations, paroxysmal nocturnal dyspnea. Dermatological: No rash, lesions/masses Respiratory: No cough, dyspnea Urologic: No hematuria, dysuria Abdominal:   No nausea, vomiting, diarrhea, bright red blood per rectum, melena, or hematemesis Neurologic:  No visual changes, wkns, changes in mental status. All other systems reviewed and are otherwise negative except as noted above.  Physical Exam    VS:  BP 138/60   Pulse 71   Ht 5' 2 (1.575 m)   Wt 137 lb (62.1 kg)   SpO2 98%   BMI 25.06 kg/m  , BMI Body mass index is 25.06 kg/m. GEN: Well nourished, well developed,  in no acute distress. HEENT: normal. Neck: Supple, no JVD, carotid bruits, or masses. Cardiac: RRR, no murmurs, rubs, or gallops. No clubbing, cyanosis, edema.  Radials/DP/PT 2+ and equal bilaterally.  Respiratory:  Respirations regular and unlabored, clear to auscultation bilaterally. GI: Soft, nontender, nondistended, BS + x 4. MS: no deformity or atrophy. Skin: warm and dry, no rash. Neuro:  Strength and sensation are intact. Psych: Normal affect.  Accessory Clinical Findings    Recent Labs: 09/23/2023: Hemoglobin 13.8; Magnesium 2.2; Platelets 241; TSH 2.595 02/16/2024: BUN 33; Creatinine, Ser 1.21; Potassium 4.1; Sodium 139   Recent Lipid Panel No results found for: CHOL, TRIG, HDL, CHOLHDL, VLDL, LDLCALC, LDLDIRECT       ECG personally reviewed by me today-none today.          Assessment & Plan   1.  Essential hypertension-BP today 138/60.  Previously she did not want increased dose of metoprolol and wished to continue to monitor blood pressure. Maintain blood pressure log Heart healthy low-sodium diet-salty 6 diet sheet given Mindfulness stress reduction Continue olmesartan , amlodipine , metoprolol Order  BMP  Hyperlipidemia-LDL 83 on 03/14/23. High-fiber diet Continue rosuvastatin, aspirin Repeat fasting lipids and lfts   Palpitations-heart rate today 71 bpm.  Denies recent episodes of accelerated or irregular heart rate. Continue metoprolol Avoid triggers caffeine, chocolate, EtOH, dehydration etc.  Disposition: Follow-up with Dr.Berry or me in 6 months.   Josefa HERO. Abi Shoults NP-C     02/29/2024, 3:20 PM [facility] [address] Office [phone] Fax [phone]    I spent minutes examining this patient, reviewing medications, and using patient centered shared decision making involving their cardiac care.   I spent  20 minutes reviewing past medical history,  medications, and prior cardiac tests.

## 2024-02-29 ENCOUNTER — Ambulatory Visit: Attending: General Practice | Admitting: General Practice

## 2024-02-29 ENCOUNTER — Encounter: Payer: Self-pay | Admitting: General Practice

## 2024-02-29 VITALS — BP 138/60 | HR 71 | Ht 62.0 in | Wt 137.0 lb

## 2024-02-29 DIAGNOSIS — R002 Palpitations: Secondary | ICD-10-CM | POA: Diagnosis not present

## 2024-02-29 DIAGNOSIS — E785 Hyperlipidemia, unspecified: Secondary | ICD-10-CM

## 2024-02-29 DIAGNOSIS — I10 Essential (primary) hypertension: Secondary | ICD-10-CM

## 2024-02-29 NOTE — Patient Instructions (Signed)
 Medication Instructions:  NO CHANGES   Lab Work: BMET TO BE DONE TODAY   Testing/Procedures: NONE  Follow-Up: At [facility], you and your health needs are our priority.  As part of our continuing mission to provide you with exceptional heart care, our providers are all part of one team.  This team includes your primary Cardiologist (physician) and Advanced Practice Providers or APPs (Physician Assistants and Nurse Practitioners) who all work together to provide you with the care you need, when you need it.  Your next appointment:   6 MONTHS  Provider:   Dorn Lesches, MD OR Josefa Beauvais, NP    Other Instructions SALTY 6 AND MINDFUL STRESS REDUCTION LITERATURE GIVEN TODAY.

## 2024-03-01 ENCOUNTER — Ambulatory Visit: Payer: Self-pay | Admitting: General Practice

## 2024-03-01 LAB — BASIC METABOLIC PANEL WITH GFR
BUN/Creatinine Ratio: 27 (ref 12–28)
BUN: 27 mg/dL (ref 10–36)
CO2: 23 mmol/L (ref 20–29)
Calcium: 10.4 mg/dL — ABNORMAL HIGH (ref 8.7–10.3)
Chloride: 103 mmol/L (ref 96–106)
Creatinine, Ser: 1 mg/dL (ref 0.57–1.00)
Glucose: 97 mg/dL (ref 70–99)
Potassium: 3.8 mmol/L (ref 3.5–5.2)
Sodium: 141 mmol/L (ref 134–144)
eGFR: 53 mL/min/1.73 — ABNORMAL LOW (ref >59)

## 2024-03-15 DIAGNOSIS — R609 Edema, unspecified: Secondary | ICD-10-CM | POA: Diagnosis not present

## 2024-03-15 DIAGNOSIS — M810 Age-related osteoporosis without current pathological fracture: Secondary | ICD-10-CM | POA: Diagnosis not present

## 2024-03-15 DIAGNOSIS — I10 Essential (primary) hypertension: Secondary | ICD-10-CM | POA: Diagnosis not present

## 2024-03-15 DIAGNOSIS — E559 Vitamin D deficiency, unspecified: Secondary | ICD-10-CM | POA: Diagnosis not present

## 2024-03-15 DIAGNOSIS — E785 Hyperlipidemia, unspecified: Secondary | ICD-10-CM | POA: Diagnosis not present

## 2024-03-15 DIAGNOSIS — Z0000 Encounter for general adult medical examination without abnormal findings: Secondary | ICD-10-CM | POA: Diagnosis not present

## 2024-04-19 DIAGNOSIS — K080 Exfoliation of teeth due to systemic causes: Secondary | ICD-10-CM | POA: Diagnosis not present

## 2024-05-30 ENCOUNTER — Ambulatory Visit: Admitting: Physician Assistant

## 2024-07-31 ENCOUNTER — Other Ambulatory Visit: Payer: Self-pay

## 2024-07-31 ENCOUNTER — Encounter (HOSPITAL_COMMUNITY): Payer: Self-pay | Admitting: Emergency Medicine

## 2024-07-31 ENCOUNTER — Emergency Department (HOSPITAL_COMMUNITY)
Admission: EM | Admit: 2024-07-31 | Discharge: 2024-08-01 | Disposition: A | Attending: Emergency Medicine | Admitting: Emergency Medicine

## 2024-07-31 DIAGNOSIS — R519 Headache, unspecified: Secondary | ICD-10-CM | POA: Diagnosis not present

## 2024-07-31 DIAGNOSIS — Z79899 Other long term (current) drug therapy: Secondary | ICD-10-CM | POA: Diagnosis not present

## 2024-07-31 DIAGNOSIS — R030 Elevated blood-pressure reading, without diagnosis of hypertension: Secondary | ICD-10-CM | POA: Diagnosis not present

## 2024-07-31 DIAGNOSIS — F0390 Unspecified dementia without behavioral disturbance: Secondary | ICD-10-CM | POA: Diagnosis not present

## 2024-07-31 DIAGNOSIS — Z7982 Long term (current) use of aspirin: Secondary | ICD-10-CM | POA: Diagnosis not present

## 2024-07-31 DIAGNOSIS — R040 Epistaxis: Secondary | ICD-10-CM | POA: Diagnosis not present

## 2024-07-31 DIAGNOSIS — I10 Essential (primary) hypertension: Secondary | ICD-10-CM | POA: Diagnosis not present

## 2024-07-31 LAB — COMPREHENSIVE METABOLIC PANEL WITH GFR
ALT: 19 U/L (ref 0–44)
AST: 29 U/L (ref 15–41)
Albumin: 4 g/dL (ref 3.5–5.0)
Alkaline Phosphatase: 41 U/L (ref 38–126)
Anion gap: 8 (ref 5–15)
BUN: 30 mg/dL — ABNORMAL HIGH (ref 8–23)
CO2: 25 mmol/L (ref 22–32)
Calcium: 10.2 mg/dL (ref 8.9–10.3)
Chloride: 108 mmol/L (ref 98–111)
Creatinine, Ser: 1.11 mg/dL — ABNORMAL HIGH (ref 0.44–1.00)
GFR, Estimated: 47 mL/min — ABNORMAL LOW (ref >60)
Glucose, Bld: 103 mg/dL — ABNORMAL HIGH (ref 70–99)
Potassium: 3.7 mmol/L (ref 3.5–5.1)
Sodium: 141 mmol/L (ref 135–145)
Total Bilirubin: 0.4 mg/dL (ref 0.0–1.2)
Total Protein: 8 g/dL (ref 6.5–8.1)

## 2024-07-31 LAB — CBC
HCT: 37.9 % (ref 36.0–46.0)
Hemoglobin: 12.8 g/dL (ref 12.0–15.0)
MCH: 30.2 pg (ref 26.0–34.0)
MCHC: 33.8 g/dL (ref 30.0–36.0)
MCV: 89.4 fL (ref 80.0–100.0)
Platelets: 232 K/uL (ref 150–400)
RBC: 4.24 MIL/uL (ref 3.87–5.11)
RDW: 12.6 % (ref 11.5–15.5)
WBC: 5.4 K/uL (ref 4.0–10.5)
nRBC: 0 % (ref 0.0–0.2)

## 2024-07-31 MED ORDER — ACETAMINOPHEN 325 MG PO TABS
650.0000 mg | ORAL_TABLET | Freq: Once | ORAL | Status: AC
  Administered 2024-07-31: 650 mg via ORAL
  Filled 2024-07-31: qty 2

## 2024-07-31 NOTE — ED Triage Notes (Addendum)
 Patient from home with an intermittent nosebleed out of the left nare since Sunday. She went to the urgent care for evaluation and they referred the patient here. Patient is not on a blood thinner. She reports hypertension while at the [facility] as well. She has a hx of same and takes medications for it. Denies dizziness, chest pain, shortness of breath at this time. Is reporting new onset of headaches intermittently for 2-3 weeks. No bleeding noted out of nose at this time. BP was 172/72 at [facility].

## 2024-07-31 NOTE — ED Provider Triage Note (Signed)
 Emergency Medicine Provider Triage Evaluation Note  Brenda Holmes , a 89 y.o. female  was evaluated in triage.  Pt complains of nose bleeds. This has been going on off and on for the past 2-3 wks. With this she has also had a headache that is resolved with tylenol . She went to urgent care for this problem earlier today and they told her that she needed to come to the ER. She is currently having a small headache. She denies and chest pain, shortness of breath, or abdominal pain.  Review of Systems  Positive: Nose bleeds Negative: Chest pain  Physical Exam  BP (!) 173/94 (BP Location: Right Arm)   Pulse 74   Temp 97.6 F (36.4 C) (Oral)   Resp (!) 24   Ht 5' 2 (1.575 m)   Wt 62 kg   SpO2 100%   BMI 25.00 kg/m  Gen:   Awake, no distress   Resp:  Normal effort  MSK:   Moves extremities without difficulty  Other:  No active epistaxis   Medical Decision Making  Medically screening exam initiated at 7:33 PM.  Appropriate orders placed.  Glyn Zendejas Horton-Wynn was informed that the remainder of the evaluation will be completed by another provider, this initial triage assessment does not replace that evaluation, and the importance of remaining in the ED until their evaluation is complete.     Rosaline Almarie MATSU, [place] 07/31/24 1936

## 2024-08-01 MED ORDER — OXYMETAZOLINE HCL 0.05 % NA SOLN
1.0000 | Freq: Once | NASAL | Status: AC
  Administered 2024-08-01: 1 via NASAL
  Filled 2024-08-01: qty 15

## 2024-08-01 NOTE — Discharge Instructions (Signed)
 Labs today were normal.   We have given you afrin here to use if nose begins bleeding again.  It is important not to use this for more than 3 days in a row. Follow-up with ENT if still having issues with nosebleeds. Return here for new concerns.

## 2024-08-01 NOTE — ED Provider Notes (Signed)
 [place] EMERGENCY DEPARTMENT AT [facility] Provider Note   CSN: [id number] Arrival date & time: 07/31/24  8161     Patient presents with: Epistaxis   Brenda Holmes is a 89 y.o. female.   The history is provided by the patient and medical records.  Epistaxis  89 year old female with history of hypertension, hyperlipidemia, dementia, presenting to the ED with nosebleed.  This has been intermittent over the past 3 days.  She denies any use of anticoagulants or antiplatelets.  States she has never had issues like this previously.  States bleeding seems to come on at random.  Did have a mild headache earlier today but she took some Tylenol  and it resolved.   Denies any fever/chills, sinus pressure, sick contacts, etc.  Has not had an recurrent bleeding since arrival in the ED over 5 hours ago.  Does report remote hx of nosebleeds due to HTN in the past.  Prior to Admission medications   Medication Sig Start Date End Date Taking? Authorizing Provider  Acetaminophen  (TYLENOL  EXTRA STRENGTH PO) Take 2 tablets by mouth daily at 6 (six) AM.    [provider]  amLODipine -olmesartan  (AZOR ) 10-40 MG tablet Take 1 tablet by mouth daily. 11/21/23   Alvan Ronal BRAVO, MD  aspirin EC 81 MG tablet Take 81 mg by mouth daily.    [provider]  calcium carbonate (OSCAL) 1500 (600 Ca) MG TABS tablet Take 1,500 mg by mouth 2 (two) times daily with a meal.    [provider]  ipratropium (ATROVENT) 0.03 % nasal spray Place 2 sprays into both nostrils every 12 (twelve) hours.    [provider]  loratadine (CLARITIN) 10 MG tablet Take 10 mg by mouth daily.    [provider]  memantine  (NAMENDA ) 10 MG tablet Take 1 tablet (10 mg) twice a day 11/29/23   Wertman, Sara E, PA-C  metoprolol succinate (TOPROL-XL) 25 MG 24 hr tablet Take 25 mg by mouth daily.    [provider]  Multiple Vitamins-Minerals (MULTIVITAMIN ADULT PO) Take 0.5 tablets by  mouth 2 (two) times daily.    [provider]  Polyethylene Glycol 3350 (MIRALAX PO) Take by mouth.    [provider]  rosuvastatin (CRESTOR) 5 MG tablet Take 5 mg by mouth every other day.    [provider]  VITAMIN D PO Take 400 Units by mouth daily. Take two capsules twice daily    [provider]  Wheat Dextrin (BENEFIBER PO) Take by mouth.    [provider]    Allergies: Gluten meal, Lactose intolerance (gi), Neosporin [neomycin-bacitracin zn-polymyx], and Hydrocortisone    Review of Systems  HENT:  Positive for nosebleeds.   All other systems reviewed and are negative.   Updated Vital Signs BP (!) 156/59   Pulse 68   Temp 97.6 F (36.4 C) (Oral)   Resp 18   Ht 5' 2 (1.575 m)   Wt 62 kg   SpO2 100%   BMI 25.00 kg/m   Physical Exam Vitals and nursing note reviewed.  Constitutional:      Appearance: She is well-developed.  HENT:     Head: Normocephalic and atraumatic.     Nose:     Comments: Small scab noted along left medial septal wall, there is no active bleeding, no septal hematoma or deformity Eyes:     Conjunctiva/sclera: Conjunctivae normal.     Pupils: Pupils are equal, round, and reactive to light.  Cardiovascular:  Rate and Rhythm: Normal rate and regular rhythm.     Heart sounds: Normal heart sounds.  Pulmonary:     Effort: Pulmonary effort is normal.     Breath sounds: Normal breath sounds.  Abdominal:     General: Bowel sounds are normal.     Palpations: Abdomen is soft.  Musculoskeletal:        General: Normal range of motion.     Cervical back: Normal range of motion.  Skin:    General: Skin is warm and dry.  Neurological:     Mental Status: She is alert and oriented to person, place, and time.     (all labs ordered are listed, but only abnormal results are displayed) Labs Reviewed  COMPREHENSIVE METABOLIC PANEL WITH GFR - Abnormal; Notable for the following components:      Result Value    Glucose, Bld 103 (*)    BUN 30 (*)    Creatinine, Ser 1.11 (*)    GFR, Estimated 47 (*)    All other components within normal limits  CBC    EKG: None  Radiology: No results found.   Procedures   Medications Ordered in the ED  oxymetazoline  (AFRIN) 0.05 % nasal spray 1 spray (has no administration in time range)  acetaminophen  (TYLENOL ) tablet 650 mg (650 mg Oral Given 07/31/24 1946)                                    Medical Decision Making Amount and/or Complexity of Data Reviewed ECG/medicine tests: ordered and independent interpretation performed.  Risk OTC drugs.   89 y.o. F here with intermittent epistaxis over the past several days.  Seems to come on at random.  Denies any facial trauma or anticoagulation use.  Had headache earlier but resolved with tylenol .    She is afebrile and nontoxic in appearance here.  She is hemodynamically stable.  Does have a small scab of the left medial septal wall.  There is no active bleeding.  No septal hematoma or deformity.  She has not had any bleeding actually since time of arrival over 5 hours ago.  Labs today are stable with normal hemoglobin, normal platelets.  BP initially elevated but downtrended here without intervention, now 156/59.  Does report nosebleeds in the past with elevated BP readings.  Given afrin spray to use PRN at home.  Advised not to use for more than 3 consecutive days.  Given ENT follow-up if symptoms persist.  Can follow-up with PCP about BP.  Can return here for new concerns.  Final diagnoses:  Epistaxis    ED Discharge Orders     None          Jarold Olam HERO, PA-C 08/01/24 0033    Trine Raynell Moder, MD 08/01/24 [phone]

## 2024-08-07 NOTE — Progress Notes (Signed)
 Brenda Holmes is a very pleasant 89 y.o. RH female with a history of HTN, HLD, borderline low heart rate and a history of dementia likely due to Alzheimer's disease seen today in follow up for memory loss. Patient is currently on memantine  10 mg twice daily, tolerating well.  This patient is accompanied in the office by her friend who supplements the history.  Previous records as well as any outside records available were reviewed prior to todays visit. Patient was last seen on 02/07/2024. Mild cognitive decline is noted.  MMSE today is  14/30. Patient is able to participate on ADLs and continues to drive very short distances without difficulties. Mood is stable    Dementia likely due to Alzheimer's disease  Follow-up in 6 months Continue memantine  10 mg twice daily, side effects discussed Recommend good control of cardiovascular risk factors, obtain clearance from cardiology to use donepezil  Continue to control mood as per PCP   Discussed the use of AI scribe software for clinical note transcription with the patient, who gave verbal consent to proceed.  History of Present Illness Brenda Holmes is a 89 year old female who presents for a follow-up on for memory loss. She is here with a friend as her husband had a medical appointment.  She experiences difficulty with short-term memory as before, including remembering new information, recent conversations, and names of people, while her long-term memory remains fair per patient's report. She occasionally repeats questions about appointments but manages this by writing them down. She enjoys reading newspapers and books at home. No disorientation, wandering tendencies, or misplacing items in unusual places.  She is a light sleeper, easily awakened by noises, but denies any nightmares or vivid dreams. No mood changes, depression, anxiety, hallucinations, or paranoia. She manages her medications independently and does not forget to take  them. She shares responsibility for managing bills with her husband and has not fallen victim to any scams.  She experiences occasional frontal headaches, which are relieved by Tylenol . Recently, she has had an episode of epistaxis for which she received medication, though no specific cause was identified. No vision changes, blindness, or worsening of glaucoma or cataracts.  She reports that she walks slowly, which is not different from previous visits, and denies any recent falls, head injuries, weakness, or numbness. No tremors or urinary incontinence. She has chronic constipation, which she manages with medication, resulting in regular bowel movements.  She lives with her husband and drives short distances without getting lost.          08/08/2024    3:00 PM 11/29/2023    5:00 PM  MMSE - Mini Mental State Exam  Orientation to time 2 2  Orientation to Place 1 2  Registration 3 3  Attention/ Calculation 0 3  Recall 0 0  Language- name 2 objects 2 2  Language- repeat 1 1  Language- follow 3 step command 3 3  Language- read & follow direction 1 1  Write a sentence 1 1  Copy design 0 0  Total score 14 18      09/23/2021    1:00 PM  Montreal Cognitive Assessment   Visuospatial/ Executive (0/5) 1  Naming (0/3) 1  Attention: Read list of digits (0/2) 2  Attention: Read list of letters (0/1) 1  Attention: Serial 7 subtraction starting at 100 (0/3) 0  Language: Repeat phrase (0/2) 2  Language : Fluency (0/1) 1  Abstraction (0/2) 2  Delayed Recall (  0/5) 1  Orientation (0/6) 6  Total 17  Adjusted Score (based on education) 17      Objective:    Neurological Exam:    VITALS:   Vitals:   08/08/24 1452 08/08/24 1523  BP: (!) 185/63 (!) 185/63  Resp: 20   Height: 5' 2 (1.575 m)     GEN:  The patient appears stated age and is in NAD. HEENT:  Normocephalic, atraumatic.   Neurological examination:  General: NAD, well-groomed, appears stated age. Orientation: The patient  is alert. Oriented to person, not to place and date Cranial nerves: There is good facial symmetry.The speech is fluent and clear. No aphasia or dysarthria. Fund of knowledge is appropriate. Recent and remote memory are impaired. Attention and concentration are reduced. Able to name objects and repeat phrases. Hearing is intact to conversational tone.  Sensation: Sensation is intact to light touch throughout Motor: Strength is at least antigravity x4. DTR's 2/4 in UE/LE     Movement examination:  Tone: There is normal tone in the UE/LE Abnormal movements:  no tremor.  No myoclonus.  No asterixis.   Coordination:  There is no decremation with RAM's. Normal finger to nose  Gait and Station: The patient has no difficulty arising out of a deep-seated chair without the use of the hands. The patient's stride length is slow.  Gait is cautious and wider based   Thank you for allowing us  the opportunity to participate in the care of this nice patient. Please do not hesitate to contact us  for any questions or concerns.   Total time spent on today's visit was 35 minutes dedicated to this patient today, preparing to see patient, examining the patient, ordering tests and/or medications and counseling the patient, documenting clinical information in the EHR or other health record, independently interpreting results and communicating results to the patient/family, discussing treatment and goals, answering patient's questions and coordinating care.  Cc:  Kip Righter, MD  Camie Sevin 08/08/2024 3:29 PM

## 2024-08-08 ENCOUNTER — Encounter: Payer: Self-pay | Admitting: Physician Assistant

## 2024-08-08 ENCOUNTER — Ambulatory Visit: Admitting: Physician Assistant

## 2024-08-08 VITALS — BP 185/63 | Resp 20 | Ht 62.0 in

## 2024-08-08 DIAGNOSIS — F0280 Dementia in other diseases classified elsewhere without behavioral disturbance: Secondary | ICD-10-CM | POA: Diagnosis not present

## 2024-08-08 DIAGNOSIS — G309 Alzheimer's disease, unspecified: Secondary | ICD-10-CM | POA: Diagnosis not present

## 2024-08-08 NOTE — Patient Instructions (Addendum)
 It was a pleasure to see you today at our office.   Recommendations:   Continue Memantine  10 mg tablets twice a day obtain clearance from cardiology to use donepezil (Aricept)  Follow up in 6 months  RECOMMENDATIONS FOR ALL PATIENTS WITH MEMORY PROBLEMS: 1. Continue to exercise (Recommend 30 minutes of walking everyday, or 3 hours every week) 2. Increase social interactions - continue going to [place] and enjoy social gatherings with friends and family 3. Eat healthy, avoid fried foods and eat more fruits and vegetables 4. Maintain adequate blood pressure, blood sugar, and blood cholesterol level. Reducing the risk of stroke and cardiovascular disease also helps promoting better memory. 5. Avoid stressful situations. Live a simple life and avoid aggravations. Organize your time and prepare for the next day in anticipation. 6. Sleep well, avoid any interruptions of sleep and avoid any distractions in the bedroom that may interfere with adequate sleep quality 7. Avoid sugar, avoid sweets as there is a strong link between excessive sugar intake, diabetes, and cognitive impairment We discussed the Mediterranean diet, which has been shown to help patients reduce the risk of progressive memory disorders and reduces cardiovascular risk. This includes eating fish, eat fruits and green leafy vegetables, nuts like almonds and hazelnuts, walnuts, and also use olive oil. Avoid fast foods and fried foods as much as possible. Avoid sweets and sugar as sugar use has been linked to worsening of memory function.  There is always a concern of gradual progression of memory problems. If this is the case, then we may need to adjust level of care according to patient needs. Support, both to the patient and caregiver, should then be put into place.    FALL PRECAUTIONS: Be cautious when walking. Scan the area for obstacles that may increase the risk of trips and falls. When getting up in the mornings, sit up at the edge  of the bed for a few minutes before getting out of bed. Consider elevating the bed at the head end to avoid drop of blood pressure when getting up. Walk always in a well-lit room (use night lights in the walls). Avoid area rugs or power cords from appliances in the middle of the walkways. Use a walker or a cane if necessary and consider physical therapy for balance exercise. Get your eyesight checked regularly.  FINANCIAL OVERSIGHT: Supervision, especially oversight when making financial decisions or transactions is also recommended.  HOME SAFETY: Consider the safety of the kitchen when operating appliances like stoves, microwave oven, and blender. Consider having supervision and share cooking responsibilities until no longer able to participate in those. Accidents with firearms and other hazards in the house should be identified and addressed as well.   ABILITY TO BE LEFT ALONE: If patient is unable to contact 911 operator, consider using LifeLine, or when the need is there, arrange for someone to stay with patients. Smoking is a fire hazard, consider supervision or cessation. Risk of wandering should be assessed by caregiver and if detected at any point, supervision and safe proof recommendations should be instituted.  MEDICATION SUPERVISION: Inability to self-administer medication needs to be constantly addressed. Implement a mechanism to ensure safe administration of the medications.   DRIVING: Regarding driving, in patients with progressive memory problems, driving will be impaired. We advise to have someone else do the driving if trouble finding directions or if minor accidents are reported. Independent driving assessment is available to determine safety of driving.   If you are interested in the driving  assessment, you can contact the following:  [facility] [employer] [facility] in [place] [phone]  [facility] [phone]  [facility]  [phone] or [phone]    Mediterranean Diet A Mediterranean diet refers to food and lifestyle choices that are based on the traditions of countries located on the [employer] [place]. This way of eating has been shown to help prevent certain conditions and improve outcomes for people who have chronic diseases, like kidney disease and heart disease. What are tips for following this plan? Lifestyle  Cook and eat meals together with your family, when possible. Drink enough fluid to keep your urine clear or pale yellow. Be physically active every day. This includes: Aerobic exercise like running or swimming. Leisure activities like gardening, walking, or housework. Get 7-8 hours of sleep each night. If recommended by your health care provider, drink red wine in moderation. This means 1 glass a day for nonpregnant women and 2 glasses a day for men. A glass of wine equals 5 oz (150 mL). Reading food labels  Check the serving size of packaged foods. For foods such as rice and pasta, the serving size refers to the amount of cooked product, not dry. Check the total fat in packaged foods. Avoid foods that have saturated fat or trans fats. Check the ingredients list for added sugars, such as corn syrup. Shopping  At the grocery store, buy most of your food from the areas near the walls of the store. This includes: Fresh fruits and vegetables (produce). Grains, beans, nuts, and seeds. Some of these may be available in unpackaged forms or large amounts (in bulk). Fresh seafood. Poultry and eggs. Low-fat dairy products. Buy whole ingredients instead of prepackaged foods. Buy fresh fruits and vegetables in-season from local farmers markets. Buy frozen fruits and vegetables in resealable bags. If you do not have access to quality fresh seafood, buy precooked frozen shrimp or canned fish, such as tuna, salmon, or sardines. Buy small amounts of raw or cooked  vegetables, salads, or olives from the deli or salad bar at your store. Stock your pantry so you always have certain foods on hand, such as olive oil, canned tuna, canned tomatoes, rice, pasta, and beans. Cooking  Cook foods with extra-virgin olive oil instead of using butter or other vegetable oils. Have meat as a side dish, and have vegetables or grains as your main dish. This means having meat in small portions or adding small amounts of meat to foods like pasta or stew. Use beans or vegetables instead of meat in common dishes like chili or lasagna. Experiment with different cooking methods. Try roasting or broiling vegetables instead of steaming or sauteing them. Add frozen vegetables to soups, stews, pasta, or rice. Add nuts or seeds for added healthy fat at each meal. You can add these to yogurt, salads, or vegetable dishes. Marinate fish or vegetables using olive oil, lemon juice, garlic, and fresh herbs. Meal planning  Plan to eat 1 vegetarian meal one day each week. Try to work up to 2 vegetarian meals, if possible. Eat seafood 2 or more times a week. Have healthy snacks readily available, such as: Vegetable sticks with hummus. Greek yogurt. Fruit and nut trail mix. Eat balanced meals throughout the week. This includes: Fruit: 2-3 servings a day Vegetables: 4-5 servings a day Low-fat dairy: 2 servings a day Fish, poultry, or lean meat: 1 serving a day Beans and legumes: 2 or more servings a week Nuts and seeds: 1-2  servings a day Whole grains: 6-8 servings a day Extra-virgin olive oil: 3-4 servings a day Limit red meat and sweets to only a few servings a month What are my food choices? Mediterranean diet Recommended Grains: Whole-grain pasta. Brown rice. Bulgar wheat. Polenta. Couscous. Whole-wheat bread. [place] Madeira. Vegetables: Artichokes. Beets. Broccoli. Cabbage. Carrots. Eggplant. Green beans. Chard. Kale. Spinach. Onions. Leeks. Peas. Squash. Tomatoes. Peppers.  Radishes. Fruits: Apples. Apricots. Avocado. Berries. Bananas. Cherries. Dates. Figs. Grapes. Lemons. Melon. Oranges. Peaches. Plums. Pomegranate. Meats and other protein foods: Beans. Almonds. Sunflower seeds. Pine nuts. Peanuts. Cod. Salmon. Scallops. Shrimp. Tuna. Tilapia. Clams. Oysters. Eggs. Dairy: Low-fat milk. Cheese. Greek yogurt. Beverages: Water. Red wine. Herbal tea. Fats and oils: Extra virgin olive oil. Avocado oil. Grape seed oil. Sweets and desserts: Greek yogurt with honey. Baked apples. Poached pears. Trail mix. Seasoning and other foods: Basil. Cilantro. Coriander. Cumin. Mint. Parsley. Sage. Rosemary. Tarragon. Garlic. Oregano. Thyme. Pepper. Balsalmic vinegar. Tahini. Hummus. Tomato sauce. Olives. Mushrooms. Limit these Grains: Prepackaged pasta or rice dishes. Prepackaged cereal with added sugar. Vegetables: Deep fried potatoes (french fries). Fruits: Fruit canned in syrup. Meats and other protein foods: Beef. Pork. Lamb. Poultry with skin. Hot dogs. [place]. Dairy: Ice cream. Sour cream. Whole milk. Beverages: Juice. Sugar-sweetened soft drinks. Beer. Liquor and spirits. Fats and oils: Butter. Canola oil. Vegetable oil. Beef fat (tallow). Lard. Sweets and desserts: Cookies. Cakes. Pies. Candy. Seasoning and other foods: Mayonnaise. Premade sauces and marinades. The items listed may not be a complete list. Talk with your dietitian about what dietary choices are right for you. Summary The Mediterranean diet includes both food and lifestyle choices. Eat a variety of fresh fruits and vegetables, beans, nuts, seeds, and whole grains. Limit the amount of red meat and sweets that you eat. Talk with your health care provider about whether it is safe for you to drink red wine in moderation. This means 1 glass a day for nonpregnant women and 2 glasses a day for men. A glass of wine equals 5 oz (150 mL). This information is not intended to replace advice given to you by your health  care provider. Make sure you discuss any questions you have with your health care provider. Document Released: 04/01/2016 Document Revised: 05/04/2016 Document Reviewed: 04/01/2016 Elsevier Interactive Patient Education  2017 [employer].

## 2024-08-20 ENCOUNTER — Ambulatory Visit
Admission: RE | Admit: 2024-08-20 | Discharge: 2024-08-20 | Disposition: A | Source: Ambulatory Visit | Attending: Family Medicine | Admitting: Family Medicine

## 2024-08-20 DIAGNOSIS — N6489 Other specified disorders of breast: Secondary | ICD-10-CM

## 2024-08-29 ENCOUNTER — Encounter: Payer: Self-pay | Admitting: Cardiovascular Disease

## 2024-09-28 ENCOUNTER — Other Ambulatory Visit: Payer: Self-pay

## 2025-02-07 ENCOUNTER — Ambulatory Visit: Payer: Self-pay | Admitting: Physician Assistant
